# Patient Record
Sex: Female | Born: 1995 | Race: White | Hispanic: No | Marital: Single | Smoking: Never smoker
Health system: Southern US, Community
[De-identification: ages and names within clinical notes are randomized; demographics above are authoritative.]

## PROBLEM LIST (undated history)

## (undated) DIAGNOSIS — F32A Depression, unspecified: Secondary | ICD-10-CM

## (undated) DIAGNOSIS — F419 Anxiety disorder, unspecified: Secondary | ICD-10-CM

## (undated) DIAGNOSIS — F329 Major depressive disorder, single episode, unspecified: Secondary | ICD-10-CM

## (undated) DIAGNOSIS — F410 Panic disorder [episodic paroxysmal anxiety] without agoraphobia: Secondary | ICD-10-CM

## (undated) DIAGNOSIS — G43909 Migraine, unspecified, not intractable, without status migrainosus: Secondary | ICD-10-CM

## (undated) HISTORY — PX: WISDOM TOOTH EXTRACTION: SHX21

## (undated) HISTORY — DX: Depression, unspecified: F32.A

## (undated) HISTORY — DX: Anxiety disorder, unspecified: F41.9

## (undated) NOTE — Progress Notes (Signed)
Formatting of this note is different from the original.  [facility] Psychiatry - Discharge Summary    Comprehensive Psychiatric Consult Date:  08/14/20  Date of Admission: 08/14/20  Date of Discharge: 09/27/2020  Attending Provider: Patricia Nettle, MD    Discharge Diagnoses     1. MDD (major depressive disorder), recurrent, in partial remission (*)     2. GAD (generalized anxiety disorder)     3. Eating disorder, unspecified type     4. Other specific personality disorders (*)       Psychiatric Treatment Course   Becky Bryan is a 48 y.o. adult with a history significant for depression, anxietyand medical history of knee pain who presented to [facility] secondary to depressive sx and SI. Presents today for scheduled discharge visit.    On admission per initial note: In 2/22 pt reports having suicidal thoughts with plan to "slit my wrists in the bathtub". They were evaluated in a psych ED and kept over night, then started a PHP then IOP, which they finished last week. Pt feels this was "not enough" and has continued to struggle with depressive sx. Reports falling back into "old habits" of poor self-care, citing a tendency to push themselves and pt other peoples needs first. Refer to biopsychosocial for further history details.    During the course of RTC client participated appropriately and there was improvement in mood, anxiety, sleep. Client received multidisciplinary therapy including CBT, DBT, processing groups, horticulture therapy, art therapy, music therapy, nutrition therapy, and meditation. Client also had weekly visits with psychiatrist and therapist, and discharge coordination. The client was offered and completed a family meeting with their partner.     On discharge visit, pt endorses some anxiety, but overall feels excited about discharge. Has a job interview coming up. Feels hopeful, motivated. Denies safety concerns. Reviewed discharge plan including DBT.    Diagnostic clarification obtained through  serial mental status examinations, direct client interview, behavioral observations, record review, and collateral information. Discharge diagnoses include MDD, as evidenced by poor self-care, decreased appetite, anhedonia, apathy, SI; GAD as evidenced by constant fears of not being liked, regular panic attacks. 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     There was a discussion of side effects, risks, benefits, alternatives, and indications for treatment with lexapro, gabapentin, propranolol, klonopin, including but not limited to HA, GI distress, dizziness, sedation, mania, SI, addiction. The client asked appropriate questions, acknowledged understanding of answers, and provided informed consent to treatment. This medication was chosen because of prior med trials and as indicated below.     Medication changes:  - started Gabapentin 100 mg tid prn anxiety  - continued home klonopin 0.25 mg bid prn severe anxiety - would recommend tapering as appropriate to limit prolonged exposure   - continued melatonin 3 mg qhs  - started Lexapro 20 mg daily for mood (last inc 4/26)   - started propranolol 20 mg bid anxiety (last inc 5/3)    Continuity of Care: Client does not currently have an outpatient psychiatry provider    Discharge Medications:     Current Outpatient Medications:   ?  acetaminophen (TYLENOL) 500 mg tablet, Take two tablets (1,000 mg dose) by mouth every 6 (six) hours as needed for up to 10 days., Disp: 30 tablet, Rfl: 0  ?  clonazePAM (  KLONOPIN) 0.5 mg tablet, Take one half tablet (0.25 mg dose) by mouth 2 (two) times a day as needed for Anxiety (severe)., Disp: 5 tablet, Rfl: 0  ?  [START ON 09/28/2020] escitalopram oxalate (LEXAPRO) 20 mg tablet,  Take one tablet (20 mg dose) by mouth daily., Disp: 30 tablet, Rfl: 0  ?  gabapentin (NEURONTIN) 100 mg capsule, Take one capsule (100 mg dose) by mouth 3 (three) times a day as needed (anxiety)., Disp: 90 capsule, Rfl: 0  ?  Melatonin 3 MG CAPS, Take one capsule (3 mg dose) by mouth at bedtime., Disp: , Rfl: 0  ?  propranolol HCl (INDERAL) 20 mg tablet, Take one tablet (20 mg dose) by mouth 2 (two) times daily., Disp: 60 tablet, Rfl: 0  No current facility-administered medications for this visit.    Facility-Administered Medications Ordered in Other Visits:   ?  acetaminophen (TYLENOL) tablet 1,000 mg, 1,000 mg, Oral, Q6H PRN, Patricia Nettle, MD, 1,000 mg at 09/23/20 0912  ?  clonazePAM (KLONOPIN) tablet 0.25 mg, 0.25 mg, Oral, BID PRN, Patricia Nettle, MD, 0.25 mg at 09/24/20 2133  ?  ENSURE 1 box, 1 box, Oral, TID PRN, Patricia Nettle, MD  ?  escitalopram oxalate (LEXAPRO) tablet TABS 20 mg, 20 mg, Oral, Daily, Patricia Nettle, MD, 20 mg at 09/27/20 1610  ?  gabapentin (NEURONTIN) capsule 100 mg, 100 mg, Oral, TID PRN, Patricia Nettle, MD, 100 mg at 09/27/20 9604  ?  Melatonin CAPS 3 mg, 3 mg, Oral, HS, Patricia Nettle, MD, 3 mg at 09/26/20 2049  ?  propranolol HCl (INDERAL) tablet 20 mg, 20 mg, Oral, BID, Patricia Nettle, MD, 20 mg at 09/27/20 5409     Adverse Effects: denies  Compliance: good  Efficacy: good  Medication refills were: Provided  Risks, benefits, and side effects were discussed in detail prior to discharge.    -Review of PMP Aware/[place] Controlled Substance Database (on first visit, prior to sending refills, and/or with controlled substance dose changes):   -Reviewed PMP Aware and is appropriate        Safety Assessment    Suicide and Violence Risk Assessment  Inclusive of [place]-Suicide Severity Rating Scale (Screening Version - since last visit)    1.) Have you wished you were dead or wished you could go to sleep and not wake up? no  2.) Have you actually had any thoughts of killing yourself? no  3.) Have you been thinking  about how you might kill yourself? no  4.) Have you had these thoughts and had some intention of acting on them? no  5.) Have you started to work out or worked out the details of how to kill yourself and do you intend to carry out this plan? no  6.) Have you done anything, started to do anything, or prepared to do anything to end your life? no    Suicide Risk  A risk assessment was performed and client is felt to be at alowacute risk for self harm. The client remains at a moderatechronically increased future suicide risk when compared to the general population.     Risk factors include:recent SI with plan, recent substance abuse, ages 18-35/65+, chronic mental illness and history of impulsivity    These risk factors are mitigated by: a marriage/significant relationship, available support system and sense of responsibility to them, purpose for living, hope for the future, current treatment compliance, effective problem solving skills, access to mental health services and beliefs about utilization, lack of access  to weapons, motivation for treatment, improving insight and judgement, safe housing, collateral contact supporting the decision to discharge safely and safety plan with follow-up care    Violence Risk  A risk assessment was performed and client is felt to be at a low acute risk for harm to others. The client remains at a low chronically increased future danger to others when compared to the general population.     Risk factors include:young age, unemployment and recent substance abuse    These risk factors are mitigated by:no known significant past HI/aggression, no significant HI/aggression during this treatment course, lack of access to weapons, treatment compliance currently, patient perceives that psychiatric treatment is needed and effective and patient has a support system that has a good understanding of mental illness    Safety Upon Discharge  On 09/27/2020, following sustained improvement in  the affect of this client, continued report of euthymic mood, repeated denial of suicidal, homicidal, and other violent ideation, adequate interaction with peers, active participation in groups, and denial of adverse reactions from medications, the treatment team decided Becky Bryan was stable for discharge to home with scheduled mental health treatment as noted below. The client's overall risk has been mitigated by acute psychiatric stabilization, optimization of psychotropic medications, and exposure to psychosocial rehabilitative groups. The client was educated about relevant modifiable risks factors including following recommendations for treatment of psychiatric illness and abstaining from substance use. While future psychiatric events cannot be accurately predicted, the client does not currently necessitate further inpatient psychiatric care and does not currently meet [facility] involuntary commitment criteria. It is recommended that the client continue in outpatient psychiatric care. A follow up plan is in place, has been discussed with the client, and the client is in agreement with the plan at this time.     Medical Treatment Course   Admission labs were reviewed and found to be  Admission on 08/14/2020, Discharged on 09/27/2020   Component Date Value Ref Range Status   ? TB Skin Test 08/16/2020 Negative  Negative, Preliminary Positive, Presumptive Positive, Invalid, A Positive, B Positive, A & B Positive Final   ? Induration 08/16/2020 0  mm Final     An EKG was performed and was NSR, QTc 446. The patient was monitored for physical complaints, including potential medication side effects. Client was offered a History and Physical. The following medical problems were addressed during this treatment: n/a    Other pertinent labs/imaging/testing results: n/a    Condition Upon Discharge   Vitals:   Vitals:    09/27/20 0943   BP: 99/70   Pulse: 76   Resp: 18   Temp: 98 F (36.7 C)        Allergies:   Allergies   Allergen Reactions   ? Banana Itching and Swelling     Review Of Systems: Review of Systems   Constitutional: Negative.    HENT: Negative.    Respiratory: Negative.    Cardiovascular: Negative.    Gastrointestinal: Negative.    Endocrine: Negative.    Genitourinary: Negative.    Musculoskeletal: Negative.    Allergic/Immunologic: Negative.    Neurological: Negative.    Hematological: Negative.    Psychiatric/Behavioral: The patient is nervous/anxious.        Physical Exam: Physical Exam  Vitals reviewed.   Constitutional:       Appearance: Normal appearance.   HENT:      Head: Atraumatic.   Eyes:      Extraocular Movements:  Extraocular movements intact.   Musculoskeletal:         General: Normal range of motion.   Pulmonary:      Effort: Pulmonary effort is normal.   Neurological:      General: No focal deficit present.      Mental Status: Atilano Ina "Sallyanne Havers" is alert.         Mental Status Exam  Constitutional:    General Appearance Well dressed, well-groomed, normal appearance.   General Behavior Pleasant and cooperative   Musculoskeletal:    Gait and Station No gait abnormalities    Strength and tone Normal   Psychiatric:    Psychomotor Activity No motor abnormalities   Speech  Normal in rate/volume/tone   Mood  "good"   Affect  Full range/appropriate and reactive; Euthymic   Thought Process Linear, logical, and goal directed   Associations Intact association   Thought Content/Perceptual Disturbances Patient denies suicidal/homicidal ideation; No evidence of auditory/visual hallucinations or delusions;   Cognition/Sensorium  AAOx4; Memory, attention, language, and fund of knowledge intact    Insight  Good   Judgment Good     PHQ9     Depression Screen 09/27/2020 09/18/2020 09/11/2020 09/04/2020 08/28/2020    Please choose the category that best describes the patient's current state 2 2 2 2 2     Not eligible on the basis of Not applicable Not applicable Not applicable Not  applicable Not applicable    1. Little interest or pleasure in doing things 1 1 3 2 2     2. Feeling down, depressed, or hopeless 0 1 2 2 1     PHQ-2 Total Score 1 2 5 4 3     PHQ-2 Interpretation of Score for Depression (Payor) Negative Negative - - -    3. Trouble falling or staying asleep 2 2 3 2 1     4. Feeling tired or having little energy 1 2 3 3 3     5. Poor appetite or overeating 2 2 2 2 3     6. Feeling bad about yourself - or that you are a failure or have let yourself or your family down 0 1 1 1 1     7. Trouble concentrating on things, such as reading the newspaper or watching television 1 2 2 3 3     8. Moving or speaking so slowly that other people could have noticed.  Or the opposite - being so fidgety or restless that you have been moving around a lot more than usual. 0 0 0 1 0    9. Thoughts that you would be better off dead, or of hurting yourself in some way. 0 1 0 1 1    10. How difficult have these problems made it for you to do your work, take care of things at home or get along with other people? Not difficult at all Somewhat difficult Somewhat difficult Somewhat difficult Somewhat difficult    PHQ Total Score 7 12 16 17 15     Interpretation: Mild Depression, repeat at follow-up Moderate Depression, recommend consideration of treatment plan, pharmacotherapy, and/or counseling Moderately Severe Depression, immediate pharmacotherapy and/or counseling recommended Moderately Severe Depression, immediate pharmacotherapy and/or counseling recommended Moderately Severe Depression, immediate pharmacotherapy and/or counseling recommended    PHQ-9 Interpretation of Score for Depression (Payor) Negative Positive Positive Positive Positive       GAD7 Review     Generalized Anxiety Disorder 7 item (GAD-7) 09/27/2020 09/18/2020 09/11/2020 09/04/2020 08/28/2020    1. Feeling nervous, anxious, or  on the edge 2 2 3 3 3     2. Not being able to stop or control worrying 2 2 3 3 3     3. Worrying too much about different things  2 3 3 3 3     4. Trouble relaxing 1 3 3 3 3     5. Being so restless that it's hard to sit still 0 1 2 2 3     6. Being easily annoyed or irritable 0 0 2 2 2     7. Feeling afraid as if something awful might happen 2 2 3 3 3     Total Score 9 13 19 19 20     Interpretation:  Mild, consider retesting at follow up appointment   Moderate, likely to have an anxiety disorder   Severe, very likely to have an anxiety disorder   Severe, very likely to have an anxiety disorder   Severe, very likely to have an anxiety disorder                CGI Score: Global Improvement: Rate total improvement, compared to condition upon admission how much has client's condition changed? 1 = Very much improved  MADRS Score: 6   HAM-A Score: 4    Discharge Appointments and Disposition    1.) Discharged to home   2.) Follow-up therapy and psychiatry community appointments scheduled by [facility] discharge coordinator, recommend DBT    Time spent performing discharge services:  - Total time spent with client, with collateral/chart review, and in documentation: 30 minutes with >50% spent counseling/coordinating care. See psychiatric treatment course for details of counseling/care coordination.     Electronically signed by:  Patricia Nettle, MD  09/27/2020 / 8:57 PM    Dragon dictation system has been used to produce this document, this system is prone to dictation errors including grammatical and punctuation errors that may have been overlooked by me. I may be contacted for clarification if necessary.     Electronically signed by Patricia Nettle, MD at 09/27/2020  9:16 PM EDT

---

## 2018-01-10 ENCOUNTER — Emergency Department (HOSPITAL_COMMUNITY)
Admission: EM | Admit: 2018-01-10 | Discharge: 2018-01-10 | Disposition: A | Discharge status: Home / Self Care | Payer: 59 | Attending: Emergency Medicine | Admitting: Emergency Medicine

## 2018-01-10 ENCOUNTER — Encounter (HOSPITAL_COMMUNITY): Payer: Self-pay | Admitting: Emergency Medicine

## 2018-01-10 ENCOUNTER — Emergency Department (HOSPITAL_COMMUNITY): Payer: 59

## 2018-01-10 DIAGNOSIS — Z79899 Other long term (current) drug therapy: Secondary | ICD-10-CM | POA: Insufficient documentation

## 2018-01-10 DIAGNOSIS — K219 Gastro-esophageal reflux disease without esophagitis: Secondary | ICD-10-CM | POA: Insufficient documentation

## 2018-01-10 DIAGNOSIS — R079 Chest pain, unspecified: Secondary | ICD-10-CM | POA: Diagnosis present

## 2018-01-10 HISTORY — DX: Major depressive disorder, single episode, unspecified: F32.9

## 2018-01-10 HISTORY — DX: Anxiety disorder, unspecified: F41.9

## 2018-01-10 HISTORY — DX: Panic disorder (episodic paroxysmal anxiety): F41.0

## 2018-01-10 HISTORY — DX: Depression, unspecified: F32.A

## 2018-01-10 HISTORY — DX: Migraine, unspecified, not intractable, without status migrainosus: G43.909

## 2018-01-10 LAB — BASIC METABOLIC PANEL
Anion gap: 11 (ref 5–15)
BUN: 10 mg/dL (ref 6–20)
CALCIUM: 9.2 mg/dL (ref 8.9–10.3)
CO2: 24 mmol/L (ref 22–32)
CREATININE: 0.84 mg/dL (ref 0.44–1.00)
Chloride: 101 mmol/L (ref 98–111)
GFR calc Af Amer: >60 mL/min (ref >60)
Glucose, Bld: 93 mg/dL (ref 70–99)
Potassium: 3.8 mmol/L (ref 3.5–5.1)
SODIUM: 136 mmol/L (ref 135–145)

## 2018-01-10 LAB — HEPATIC FUNCTION PANEL
ALBUMIN: 4.1 g/dL (ref 3.5–5.0)
ALK PHOS: 45 U/L (ref 38–126)
ALT: 13 U/L (ref 0–44)
AST: 18 U/L (ref 15–41)
BILIRUBIN TOTAL: 0.7 mg/dL (ref 0.3–1.2)
Bilirubin, Direct: 0.1 mg/dL (ref 0.0–0.2)
Indirect Bilirubin: 0.6 mg/dL (ref 0.3–0.9)
Total Protein: 7.5 g/dL (ref 6.5–8.1)

## 2018-01-10 LAB — CBC
HCT: 43.9 % (ref 36.0–46.0)
Hemoglobin: 14.6 g/dL (ref 12.0–15.0)
MCH: 29.9 pg (ref 26.0–34.0)
MCHC: 33.3 g/dL (ref 30.0–36.0)
MCV: 89.8 fL (ref 78.0–100.0)
PLATELETS: 334 10*3/uL (ref 150–400)
RBC: 4.89 MIL/uL (ref 3.87–5.11)
RDW: 12.3 % (ref 11.5–15.5)
WBC: 8.1 10*3/uL (ref 4.0–10.5)

## 2018-01-10 LAB — I-STAT BETA HCG BLOOD, ED (MC, WL, AP ONLY)

## 2018-01-10 LAB — I-STAT TROPONIN, ED: Troponin i, poc: 0.02 ng/mL (ref 0.00–0.08)

## 2018-01-10 LAB — LIPASE, BLOOD: Lipase: 32 U/L (ref 11–51)

## 2018-01-10 MED ORDER — OMEPRAZOLE 20 MG PO CPDR: 20.0000 mg | DELAYED_RELEASE_CAPSULE | Freq: Every day | ORAL | 1 refills | Status: DC

## 2018-01-10 MED ORDER — GI COCKTAIL ~~LOC~~
30.0000 mL | Freq: Once | ORAL | Status: AC
  Administered 2018-01-10: 30 mL via ORAL
  Filled 2018-01-10: qty 30

## 2018-01-10 NOTE — ED Provider Notes (Signed)
[facility] EMERGENCY DEPARTMENT Provider Note   CSN: [id number] Arrival date & time: 01/10/18  1828     History   Chief Complaint Chief Complaint  Patient presents with  . Chest Pain    HPI Erika Durham is a 22 y.o. female.  HPI Pt started having sharp pain on the left side of her chest.  She also has noticed numbness and tingling in her left arm.   The symptoms started around 4pm.  SHe thought it could be anxiety so she took her klonopin but her chest pain got worse.  NO history of heart disease or DVT.  NO history of heart or lung disease in the immediate family.      Past Medical History:  Diagnosis Date  . Anxiety   . Depression   . Migraine   . Panic attack     There are no active problems to display for this patient.   History reviewed. No pertinent surgical history.   OB History   None      Home Medications    Prior to Admission medications   Medication Sig Start Date End Date Taking? Authorizing Provider  buPROPion (WELLBUTRIN XL) 300 MG 24 hr tablet Take 300 mg by mouth daily. 01/02/18  Yes [provider]  clonazePAM (KLONOPIN) 1 MG disintegrating tablet Take 1 mg by mouth daily as needed for anxiety.   Yes [provider]  omeprazole (PRILOSEC) 20 MG capsule Take 1 capsule (20 mg total) by mouth daily. 01/10/18   Linwood Dibbles, MD    Family History No family history on file.  Social History Social History   Tobacco Use  . Smoking status: Not on file  Substance Use Topics  . Alcohol use: Not on file  . Drug use: Not on file     Allergies   Rizatriptan   Review of Systems Review of Systems  All other systems reviewed and are negative.    Physical Exam Updated Vital Signs BP 129/90 (BP Location: Right Arm)   Pulse 97   Temp 98.5 F (36.9 C) (Oral)   Resp 17   LMP 01/03/2018   SpO2 98%   Physical Exam  Constitutional: She appears well-developed and well-nourished. No distress.  HENT:  Head:  Normocephalic and atraumatic.  Right Ear: External ear normal.  Left Ear: External ear normal.  Eyes: Conjunctivae are normal. Right eye exhibits no discharge. Left eye exhibits no discharge. No scleral icterus.  Neck: Neck supple. No tracheal deviation present.  Cardiovascular: Normal rate, regular rhythm and intact distal pulses.  Pulmonary/Chest: Effort normal and breath sounds normal. No stridor. No respiratory distress. She has no wheezes. She has no rales.  Abdominal: Soft. Bowel sounds are normal. She exhibits no distension. There is tenderness. There is no rebound and no guarding.  ttp epigastric region  Musculoskeletal: She exhibits no edema or tenderness.  Neurological: She is alert. She has normal strength. No cranial nerve deficit (no facial droop, extraocular movements intact, no slurred speech) or sensory deficit. She exhibits normal muscle tone. She displays no seizure activity. Coordination normal.  Skin: Skin is warm and dry. No rash noted.  Psychiatric: She has a normal mood and affect.  Nursing note and vitals reviewed.    ED Treatments / Results  Labs (all labs ordered are listed, but only abnormal results are displayed) Labs Reviewed  BASIC METABOLIC PANEL  CBC  LIPASE, BLOOD  HEPATIC FUNCTION PANEL  I-STAT TROPONIN, ED  I-STAT BETA HCG  BLOOD, ED ([facility], [facility], [facility] ONLY)    EKG EKG Interpretation  Date/Time:  Saturday January 10 2018 18:43:32 EDT Ventricular Rate:  94 PR Interval:  146 QRS Duration: 92 QT Interval:  364 QTC Calculation: 455 R Axis:   82 Text Interpretation:  Normal sinus rhythm Normal ECG No old tracing to compare Confirmed by Linwood Dibbles [id number]) on 01/10/2018 9:00:07 PM   Radiology Dg Chest 2 View  Result Date: 01/10/2018 CLINICAL DATA:  Chest pain, shortness of breath, and dizziness beginning this morning. EXAM: CHEST - 2 VIEW COMPARISON:  None. FINDINGS: The heart size and mediastinal contours are within normal limits. Both lungs are clear.  The visualized skeletal structures are unremarkable. IMPRESSION: Negative.  No active cardiopulmonary disease. Electronically Signed   By: Myles Rosenthal M.D.   On: 01/10/2018 19:37    Procedures Procedures (including critical care time)  Medications Ordered in ED Medications  gi cocktail (Maalox,Lidocaine,Donnatal) (30 mLs Oral Given 01/10/18 2123)     Initial Impression / Assessment and Plan / ED Course  I have reviewed the triage vital signs and the nursing notes.  Pertinent labs & imaging results that were available during my care of the patient were reviewed by me and considered in my medical decision making (see chart for details).   Patient presented to the emergency room for evaluation of chest pain.  On exam the patient had some mild epigastric tenderness.  Laboratory tests are reassuring.  She is very low risk for acute coronary syndromes.  EKG and cardiac enzymes are normal.  Patient also is risk for PE and PERC negative.  Patient may be having some esophageal spasm associated with gastroesophageal reflux.  Plan on discharge home with antacids.  Follow-up with primary care doctor.  Final Clinical Impressions(s) / ED Diagnoses   Final diagnoses:  Gastroesophageal reflux disease, esophagitis presence not specified    ED Discharge Orders         Ordered    omeprazole (PRILOSEC) 20 MG capsule  Daily     01/10/18 2206           Linwood Dibbles, MD 01/10/18 2209

## 2018-01-10 NOTE — ED Triage Notes (Signed)
Pt states 2 hours of chest pain to left breast, left arm, pt also feels dizzy with shortness of breath. States she has hx of anxiety and panic attacks but states this does not feel like a panic attack and her anxiety medication did not help. Pain currently 4/10.

## 2018-01-10 NOTE — Discharge Instructions (Addendum)
Take the medications as prescribed, follow-up with a primary care doctor °

## 2018-01-10 NOTE — ED Notes (Signed)
Main lab to add on HFP, lipase

## 2018-01-10 NOTE — ED Notes (Signed)
ED Provider at bedside. 

## 2018-11-07 ENCOUNTER — Other Ambulatory Visit: Payer: Self-pay

## 2018-11-07 ENCOUNTER — Ambulatory Visit (HOSPITAL_COMMUNITY)
Admission: EM | Admit: 2018-11-07 | Discharge: 2018-11-07 | Disposition: A | Discharge status: Home / Self Care | Payer: 59 | Attending: Family Medicine | Admitting: Family Medicine

## 2018-11-07 ENCOUNTER — Ambulatory Visit (INDEPENDENT_AMBULATORY_CARE_PROVIDER_SITE_OTHER): Payer: 59

## 2018-11-07 ENCOUNTER — Encounter (HOSPITAL_COMMUNITY): Payer: Self-pay

## 2018-11-07 DIAGNOSIS — S92425A Nondisplaced fracture of distal phalanx of left great toe, initial encounter for closed fracture: Secondary | ICD-10-CM

## 2018-11-07 MED ORDER — IBUPROFEN 800 MG PO TABS
ORAL_TABLET | ORAL | Status: AC
  Filled 2018-11-07: qty 1

## 2018-11-07 MED ORDER — IBUPROFEN 800 MG PO TABS
800.0000 mg | ORAL_TABLET | Freq: Once | ORAL | Status: AC
  Administered 2018-11-07: 14:00:00 800 mg via ORAL

## 2018-11-07 NOTE — ED Triage Notes (Signed)
Patient presents to Urgent Care with complaints of left big toe pain since a bed frame fell on her foot last night. Patient reports she has sharp pain when trying to bend her toes, pt needs work note to be out of work today.

## 2018-11-07 NOTE — ED Provider Notes (Signed)
[facility]   [id number] 11/07/18 Arrival Time: 1153  ASSESSMENT & PLAN:  1. Nondisplaced fracture of distal phalanx of left great toe, initial encounter for closed fracture     I have personally viewed the imaging studies ordered this visit. L great toe fracture.  Meds ordered this encounter  Medications  . ibuprofen (ADVIL) tablet 800 mg    Orders Placed This Encounter  Procedures  . DG Foot Complete Left  . Apply cam walker    Follow-up Information    [facility], [facility].   Specialty: Specialist Contact information: [address] [place] [id number] [phone]          OTC ibuprofen/Tylenol as needed. Work note provided.  Reviewed expectations re: course of current medical issues. Questions answered. Outlined signs and symptoms indicating need for more acute intervention. Patient verbalized understanding. After Visit Summary given.  SUBJECTIVE: History from: patient. Erika Durham is a 23 y.o. female who reports fairly persistent moderate pain of her left great toe; described as aching without radiation. Onset: abrupt, yest evening. Injury/trama: reports bed frame fell onto foot/toe. Symptoms have progressed to a point and plateaued since beginning. Aggravating factors: movement/weight bearing. Alleviating factors: rest. Associated symptoms: none reported. Extremity sensation changes or weakness: none. Self treatment: ibuprofen; helps with pain. History of similar: no.  History reviewed. No pertinent surgical history.   ROS: As per HPI. All other systems negative.   OBJECTIVE:  Vitals:   11/07/18 1230  BP: 116/74  Pulse: 96  Resp: 16  Temp: 98.5 F (36.9 C)  TempSrc: Oral  SpO2: 100%    General appearance: alert; no distress HEENT: [place]; AT Neck: supple with FROM Resp: unlabored respirations Extremities: . LLE: warm and well perfused; well localized moderate tenderness over left mid to distal great toe; without  gross deformities; with mild swelling; with mild bruising; ROM: limited by pain CV: brisk extremity capillary refill of LLE; 2+ DP/PT pulse of LLE. Skin: warm and dry; no visible rashes Neurologic: gait normal but favors LLE; normal reflexes of LLE; normal sensation of LLE; normal strength of LLE Psychological: alert and cooperative; normal mood and affect  Imaging: Dg Foot Complete Left  Result Date: 11/07/2018 CLINICAL DATA:  Injury, pain, trauma EXAM: LEFT FOOT - COMPLETE 3+ VIEW COMPARISON:  None. FINDINGS: There is a subtle linear fracture without displacement of the left great toe distal phalanx laterally. Mild diffuse soft tissue swelling. No associated subluxation or dislocation. No other joint or osseous abnormality. IMPRESSION: Acute nondisplaced fracture left great toe distal phalanx. Electronically Signed   By: Judie PetitM.  Shick M.D.   On: 11/07/2018 13:33      Allergies  Allergen Reactions  . Rizatriptan Anxiety    Other reaction(s): Other (See Comments) Body wide migraine.  Right side body pain.     Past Medical History:  Diagnosis Date  . Anxiety   . Depression   . Migraine   . Panic attack    Social History   Socioeconomic History  . Marital status: Single    Spouse name: Not on file  . Number of children: Not on file  . Years of education: Not on file  . Highest education level: Not on file  Occupational History  . Not on file  Social Needs  . Financial resource strain: Not on file  . Food insecurity    Worry: Not on file    Inability: Not on file  . Transportation needs    Medical: Not on file  Non-medical: Not on file  Tobacco Use  . Smoking status: Never Smoker  . Smokeless tobacco: Never Used  Substance and Sexual Activity  . Alcohol use: Yes    Comment: socially  . Drug use: Yes    Types: Marijuana    Comment: socially  . Sexual activity: Not on file  Lifestyle  . Physical activity    Days per week: Not on file    Minutes per session: Not on  file  . Stress: Not on file  Relationships  . Social Herbalist on phone: Not on file    Gets together: Not on file    Attends religious service: Not on file    Active member of club or organization: Not on file    Attends meetings of clubs or organizations: Not on file    Relationship status: Not on file  Other Topics Concern  . Not on file  Social History Narrative  . Not on file   Family History  Problem Relation Age of Onset  . Healthy Mother   . Healthy Father    History reviewed. No pertinent surgical history.    Vanessa Kick, MD 11/07/18 1406

## 2019-08-12 ENCOUNTER — Other Ambulatory Visit: Payer: Self-pay

## 2019-08-12 ENCOUNTER — Encounter (HOSPITAL_COMMUNITY): Payer: Self-pay | Admitting: Emergency Medicine

## 2019-08-12 ENCOUNTER — Emergency Department (HOSPITAL_COMMUNITY)
Admission: EM | Admit: 2019-08-12 | Discharge: 2019-08-12 | Disposition: A | Discharge status: Home / Self Care | Payer: 59 | Attending: Emergency Medicine | Admitting: Emergency Medicine

## 2019-08-12 ENCOUNTER — Emergency Department (HOSPITAL_COMMUNITY): Payer: 59

## 2019-08-12 DIAGNOSIS — Y929 Unspecified place or not applicable: Secondary | ICD-10-CM | POA: Insufficient documentation

## 2019-08-12 DIAGNOSIS — Y9301 Activity, walking, marching and hiking: Secondary | ICD-10-CM | POA: Diagnosis not present

## 2019-08-12 DIAGNOSIS — Y999 Unspecified external cause status: Secondary | ICD-10-CM | POA: Insufficient documentation

## 2019-08-12 DIAGNOSIS — S8992XA Unspecified injury of left lower leg, initial encounter: Secondary | ICD-10-CM | POA: Diagnosis present

## 2019-08-12 DIAGNOSIS — W0110XA Fall on same level from slipping, tripping and stumbling with subsequent striking against unspecified object, initial encounter: Secondary | ICD-10-CM | POA: Insufficient documentation

## 2019-08-12 MED ORDER — OXYCODONE-ACETAMINOPHEN 5-325 MG PO TABS: 1.0000 | ORAL_TABLET | Freq: Four times a day (QID) | ORAL | 0 refills | Status: DC | PRN

## 2019-08-12 MED ORDER — HYDROMORPHONE HCL 1 MG/ML IJ SOLN
0.5000 mg | Freq: Once | INTRAMUSCULAR | Status: AC
  Administered 2019-08-12: 0.5 mg via INTRAMUSCULAR
  Filled 2019-08-12: qty 1

## 2019-08-12 NOTE — ED Provider Notes (Signed)
[facility]-EMERGENCY DEPT Provider Note   CSN: [id number] Arrival date & time: 08/12/19  0033     History Chief Complaint  Patient presents with  . Knee Pain    Erika Durham is a 24 y.o. female with a history of anxiety and depression who presents to the emergency department with a chief complaint of left knee injury.  The patient reports that she was walking earlier tonight when she tripped and "dislocated her knee" reports that when she looked down at the knee that her kneecap appeared off to the side.  She reports sudden onset, severe pain.  She was unable to stand or put weight on the leg.  She reports that while she was laying on the ground that she was able to straighten her leg and felt the knee pop back into place.  She reports there was some improvement in pain, but she still is unable to bear weight on the leg.  Reports that she had a similar episode when she was 11.  She has no numbness or weakness.  No ankle or hip pain.  No other treatment prior to arrival.  She is not established with an orthopedist.  The history is provided by the patient. No language interpreter was used.       Past Medical History:  Diagnosis Date  . Anxiety   . Depression   . Migraine   . Panic attack     There are no problems to display for this patient.   History reviewed. No pertinent surgical history.   OB History   No obstetric history on file.     Family History  Problem Relation Age of Onset  . Healthy Mother   . Healthy Father     Social History   Tobacco Use  . Smoking status: Never Smoker  . Smokeless tobacco: Never Used  Substance Use Topics  . Alcohol use: Yes    Comment: socially  . Drug use: Yes    Types: Marijuana    Comment: socially    Home Medications Prior to Admission medications   Medication Sig Start Date End Date Taking? Authorizing Provider  buPROPion (WELLBUTRIN XL) 300 MG 24 hr tablet Take 300 mg by mouth daily. 01/02/18    [provider]  clonazePAM (KLONOPIN) 1 MG disintegrating tablet Take 1 mg by mouth daily as needed for anxiety.    [provider]  omeprazole (PRILOSEC) 20 MG capsule Take 1 capsule (20 mg total) by mouth daily. 01/10/18   Linwood Dibbles, MD  oxyCODONE-acetaminophen (PERCOCET/ROXICET) 5-325 MG tablet Take 1 tablet by mouth every 6 (six) hours as needed for severe pain. 08/12/19   Trellis Guirguis A, PA-C    Allergies    Rizatriptan  Review of Systems   Review of Systems  Constitutional: Negative for activity change.  Respiratory: Negative for shortness of breath.   Cardiovascular: Negative for chest pain.  Gastrointestinal: Negative for abdominal pain, diarrhea, nausea and vomiting.  Genitourinary: Negative for dysuria.  Musculoskeletal: Positive for arthralgias and myalgias. Negative for back pain, joint swelling, neck pain and neck stiffness.  Skin: Negative for rash.  Allergic/Immunologic: Negative for immunocompromised state.  Neurological: Negative for dizziness, seizures, syncope, weakness, numbness and headaches.  Psychiatric/Behavioral: Negative for confusion.    Physical Exam Updated Vital Signs BP 120/89 (BP Location: Right Arm)   Pulse 80   Temp 98 F (36.7 C) (Oral)   Resp 16   Ht 5\' 1"  (1.549 m)   Wt 79.4  kg   LMP 08/01/2019   SpO2 100%   BMI 33.07 kg/m   Physical Exam Vitals and nursing note reviewed.  Constitutional:      General: She is not in acute distress. HENT:     Head: Normocephalic.  Eyes:     Conjunctiva/sclera: Conjunctivae normal.  Cardiovascular:     Rate and Rhythm: Normal rate and regular rhythm.     Heart sounds: No murmur. No friction rub. No gallop.   Pulmonary:     Effort: Pulmonary effort is normal. No respiratory distress.  Abdominal:     General: There is no distension.     Palpations: Abdomen is soft.  Musculoskeletal:     Cervical back: Neck supple.     Right lower leg: No edema.     Left lower leg: No edema.      Comments: Diffusely tender to palpation to the left knee.  No obvious deformities.  Normal exam of the left ankle.  Range of motion of the left hip deferred.  Patient has considerable pain with range of motion of the left knee.  She is neurovascularly intact to the left lower extremity.   Skin:    General: Skin is warm.     Findings: No rash.  Neurological:     Mental Status: She is alert.  Psychiatric:        Behavior: Behavior normal.     ED Results / Procedures / Treatments   Labs (all labs ordered are listed, but only abnormal results are displayed) Labs Reviewed - No data to display  EKG None  Radiology DG Knee Complete 4 Views Left  Result Date: 08/12/2019 CLINICAL DATA:  Status post trauma. EXAM: LEFT KNEE - COMPLETE 4+ VIEW COMPARISON:  None. FINDINGS: No evidence of fracture, dislocation, or joint effusion. No evidence of arthropathy or other focal bone abnormality. Soft tissues are unremarkable. IMPRESSION: Negative. Electronically Signed   By: Aram Candela M.D.   On: 08/12/2019 01:56    Procedures Procedures (including critical care time)  Medications Ordered in ED Medications  HYDROmorphone (DILAUDID) injection 0.5 mg (0.5 mg Intramuscular Given 08/12/19 0454)    ED Course  I have reviewed the triage vital signs and the nursing notes.  Pertinent labs & imaging results that were available during my care of the patient were reviewed by me and considered in my medical decision making (see chart for details).    MDM Rules/Calculators/A&P                      24 year old female with history of depression anxiety who presents to the emergency department after a left knee injury earlier tonight.  Reports that she tripped and injured her left knee.  When she looked down, she saw the kneecap off to the side of the knee.  She was able to straighten her leg out while she was on the ground and the patella moved back into place.  On exam, she is diffusely tender to  palpation to the left knee.  No obvious deformities.  She is neurovascularly intact.  Joint does not appear unstable.  X-ray of the left knee is unremarkable.  Suspect the patient had a patellar dislocation.  Doubt knee dislocation She reports of history of prior dislocation in childhood.  She is not established with an orthopedist.  We will place the patient in a knee immobilizer and give her a set of crutches.  Will provide referral to orthopedics.  Pain controlled in  the ER.  We will send her home with a short course of pain medication.  RICE therapy recommended.  She is hemodynamically stable to no acute distress.  Safe for discharge home with outpatient follow-up as indicated.  Final Clinical Impression(s) / ED Diagnoses Final diagnoses:  Injury of left knee, initial encounter    Rx / DC Orders ED Discharge Orders         Ordered    oxyCODONE-acetaminophen (PERCOCET/ROXICET) 5-325 MG tablet  Every 6 hours PRN     08/12/19 0447           Danielys Madry, Maree Erie A, PA-C 08/12/19 2330    Varney Biles, MD 08/19/19 [phone]

## 2019-08-12 NOTE — Discharge Instructions (Signed)
Thank you for allowing me to care for you today in the Emergency Department.   Wear the knee immobilizer 24 hours a day until you are seen by Dr. Ave Filter or one of his colleagues.  Dr. Ave Filter is an orthopedist.  His office information is listed above.  Use the crutches until you can put weight on the left foot without considerable pain.  Take 650 mg of Tylenol or 600 mg of ibuprofen with food every 6 hours for pain.  You can alternate between these 2 medications every 3 hours if your pain returns.  For instance, you can take Tylenol at noon, followed by a dose of ibuprofen at 3, followed by second dose of Tylenol and 6.  For severe, uncontrollable pain, you can take 1 tablet of Percocet.  Percocet is a narcotic.  It can be addicting.  Do not take this with other medications or substances, including alcohol, that may make you drowsy.  Do not work or drive while taking this medication because it causes you to be impaired.  You should only use this medication if your pain is severe and uncontrollable.  Try to elevate your left leg so that your toes are at or above the level of your nose to help with pain and swelling.  Return to the emergency department if you have any fall or injury, if you develop severe swelling in your calf or thigh, if you have new numbness or weakness, if your toes or foot turns blue, or if you develop other new, concerning symptoms.

## 2020-06-13 ENCOUNTER — Other Ambulatory Visit: Payer: Self-pay

## 2020-06-13 ENCOUNTER — Encounter (HOSPITAL_COMMUNITY): Payer: Self-pay | Admitting: Emergency Medicine

## 2020-06-13 ENCOUNTER — Ambulatory Visit (HOSPITAL_COMMUNITY)
Admission: RE | Admit: 2020-06-13 | Discharge: 2020-06-13 | Disposition: A | Discharge status: Home / Self Care | Payer: 59 | Attending: Internal Medicine | Admitting: Internal Medicine

## 2020-06-13 ENCOUNTER — Ambulatory Visit (HOSPITAL_COMMUNITY)
Admission: EM | Admit: 2020-06-13 | Discharge: 2020-06-14 | Disposition: A | Discharge status: Home / Self Care | Payer: 59 | Attending: Psychiatric/Mental Health | Admitting: Psychiatric/Mental Health

## 2020-06-13 DIAGNOSIS — Z79899 Other long term (current) drug therapy: Secondary | ICD-10-CM | POA: Insufficient documentation

## 2020-06-13 DIAGNOSIS — F332 Major depressive disorder, recurrent severe without psychotic features: Secondary | ICD-10-CM | POA: Diagnosis present

## 2020-06-13 DIAGNOSIS — Z20822 Contact with and (suspected) exposure to covid-19: Secondary | ICD-10-CM | POA: Diagnosis not present

## 2020-06-13 LAB — LIPID PANEL
Cholesterol: 159 mg/dL (ref 0–200)
HDL: 52 mg/dL (ref >40)
LDL Cholesterol: 96 mg/dL (ref 0–99)
Total CHOL/HDL Ratio: 3.1 RATIO
Triglycerides: 53 mg/dL (ref <150)
VLDL: 11 mg/dL (ref 0–40)

## 2020-06-13 LAB — POCT URINALYSIS DIP (DEVICE)
Bilirubin Urine: NEGATIVE
Glucose, UA: NEGATIVE mg/dL
Hgb urine dipstick: NEGATIVE
Ketones, ur: 15 mg/dL — AB
Leukocytes,Ua: NEGATIVE
Nitrite: NEGATIVE
Protein, ur: NEGATIVE mg/dL
Specific Gravity, Urine: >=1.03 (ref 1.005–1.030)
Urobilinogen, UA: 0.2 mg/dL (ref 0.0–1.0)
pH: 7 (ref 5.0–8.0)

## 2020-06-13 LAB — CBC WITH DIFFERENTIAL/PLATELET
Abs Immature Granulocytes: 0.02 10*3/uL (ref 0.00–0.07)
Basophils Absolute: 0 10*3/uL (ref 0.0–0.1)
Basophils Relative: 0 %
Eosinophils Absolute: 0.2 10*3/uL (ref 0.0–0.5)
Eosinophils Relative: 2 %
HCT: 44.5 % (ref 36.0–46.0)
Hemoglobin: 15.8 g/dL — ABNORMAL HIGH (ref 12.0–15.0)
Immature Granulocytes: 0 %
Lymphocytes Relative: 29 %
Lymphs Abs: 2.7 10*3/uL (ref 0.7–4.0)
MCH: 31.1 pg (ref 26.0–34.0)
MCHC: 35.5 g/dL (ref 30.0–36.0)
MCV: 87.6 fL (ref 80.0–100.0)
Monocytes Absolute: 0.5 10*3/uL (ref 0.1–1.0)
Monocytes Relative: 6 %
Neutro Abs: 5.9 10*3/uL (ref 1.7–7.7)
Neutrophils Relative %: 63 %
Platelets: 335 10*3/uL (ref 150–400)
RBC: 5.08 MIL/uL (ref 3.87–5.11)
RDW: 12.8 % (ref 11.5–15.5)
WBC: 9.3 10*3/uL (ref 4.0–10.5)
nRBC: 0 % (ref 0.0–0.2)

## 2020-06-13 LAB — POCT URINE DRUG SCREEN - MANUAL ENTRY (I-SCREEN)
POC Amphetamine UR: NOT DETECTED
POC Buprenorphine (BUP): NOT DETECTED
POC Cocaine UR: NOT DETECTED
POC Marijuana UR: NOT DETECTED
POC Methadone UR: NOT DETECTED
POC Methamphetamine UR: NOT DETECTED
POC Morphine: NOT DETECTED
POC Oxazepam (BZO): NOT DETECTED
POC Oxycodone UR: NOT DETECTED
POC Secobarbital (BAR): NOT DETECTED

## 2020-06-13 LAB — COMPREHENSIVE METABOLIC PANEL
ALT: 15 U/L (ref 0–44)
AST: 24 U/L (ref 15–41)
Albumin: 4.8 g/dL (ref 3.5–5.0)
Alkaline Phosphatase: 37 U/L — ABNORMAL LOW (ref 38–126)
Anion gap: 12 (ref 5–15)
BUN: 8 mg/dL (ref 6–20)
CO2: 23 mmol/L (ref 22–32)
Calcium: 9.6 mg/dL (ref 8.9–10.3)
Chloride: 102 mmol/L (ref 98–111)
Creatinine, Ser: 0.76 mg/dL (ref 0.44–1.00)
GFR, Estimated: >60 mL/min (ref >60)
Glucose, Bld: 68 mg/dL — ABNORMAL LOW (ref 70–99)
Potassium: 4.5 mmol/L (ref 3.5–5.1)
Sodium: 137 mmol/L (ref 135–145)
Total Bilirubin: 1.4 mg/dL — ABNORMAL HIGH (ref 0.3–1.2)
Total Protein: 7.9 g/dL (ref 6.5–8.1)

## 2020-06-13 LAB — RESP PANEL BY RT-PCR (FLU A&B, COVID) ARPGX2
Influenza A by PCR: NEGATIVE
Influenza B by PCR: NEGATIVE
SARS Coronavirus 2 by RT PCR: NEGATIVE

## 2020-06-13 LAB — TSH: TSH: 1.499 u[IU]/mL (ref 0.350–4.500)

## 2020-06-13 LAB — POCT PREGNANCY, URINE: Preg Test, Ur: NEGATIVE

## 2020-06-13 LAB — POC SARS CORONAVIRUS 2 AG -  ED: SARS Coronavirus 2 Ag: NEGATIVE

## 2020-06-13 LAB — ETHANOL: Alcohol, Ethyl (B): <10 mg/dL (ref <10)

## 2020-06-13 LAB — POC SARS CORONAVIRUS 2 AG: SARS Coronavirus 2 Ag: NEGATIVE

## 2020-06-13 MED ORDER — ACETAMINOPHEN 325 MG PO TABS: 650.0000 mg | ORAL_TABLET | Freq: Four times a day (QID) | ORAL | Status: DC | PRN

## 2020-06-13 MED ORDER — ALUM & MAG HYDROXIDE-SIMETH 200-200-20 MG/5ML PO SUSP: 30.0000 mL | ORAL | Status: DC | PRN

## 2020-06-13 MED ORDER — CLONAZEPAM 0.25 MG PO TBDP: 0.2500 mg | ORAL_TABLET | Freq: Every day | ORAL | Status: DC | PRN

## 2020-06-13 MED ORDER — TRAZODONE HCL 50 MG PO TABS
50.0000 mg | ORAL_TABLET | Freq: Every evening | ORAL | Status: DC | PRN
  Administered 2020-06-14: 50 mg via ORAL
  Filled 2020-06-13: qty 1

## 2020-06-13 MED ORDER — BUPROPION HCL ER (XL) 150 MG PO TB24
150.0000 mg | ORAL_TABLET | Freq: Every day | ORAL | Status: DC
  Administered 2020-06-14: 150 mg via ORAL
  Filled 2020-06-13: qty 1

## 2020-06-13 MED ORDER — MAGNESIUM HYDROXIDE 400 MG/5ML PO SUSP: 30.0000 mL | Freq: Every day | ORAL | Status: DC | PRN

## 2020-06-13 MED ORDER — HYDROXYZINE HCL 25 MG PO TABS
25.0000 mg | ORAL_TABLET | Freq: Three times a day (TID) | ORAL | Status: DC | PRN
  Administered 2020-06-14: 25 mg via ORAL
  Filled 2020-06-13: qty 1

## 2020-06-13 NOTE — ED Triage Notes (Signed)
Presents with depression, anxiety and suicidal thoughts increasing in severity for past week. Plan to slit wrists.  Denies HI or AVH.

## 2020-06-13 NOTE — H&P (Signed)
Behavioral Health Medical Screening Exam  Erika Durham is an 25 y.o. female with history of anxiety and depression. She is presenting for increased depression with SI to slit her wrists in the bathtub. She reports SI started 8 days ago but worsened and developed a plan today. Denies HI/AVH. She is currently seeing a counselor but denies any medications in the last year, other than PRN Klonopin 0.25 mg, which she reports taking about once per week, verified on PDMP review. She has history of suicide attempt via overdose on pills when she was 17 and was hospitalized at [facility] at that time. She reports doing IOP at [facility] one year ago. Denies HI/AVH. She reports doing well on Wellbutrin in the past.   Total Time spent with patient: 30 minutes  Psychiatric Specialty Exam: Physical Exam Vitals and nursing note reviewed.  Constitutional:      Appearance: She is well-developed and well-nourished.  Cardiovascular:     Rate and Rhythm: Normal rate.  Pulmonary:     Effort: Pulmonary effort is normal.  Neurological:     Mental Status: She is alert and oriented to person, place, and time.    Review of Systems  Constitutional: Negative.   Respiratory: Negative for cough and shortness of breath.   Psychiatric/Behavioral: Positive for dysphoric mood and suicidal ideas. Negative for agitation, behavioral problems, confusion, decreased concentration, hallucinations, self-injury and sleep disturbance. The patient is nervous/anxious. The patient is not hyperactive.    Blood pressure 124/90, pulse 95, temperature 98.6 F (37 C), temperature source Oral, resp. rate 18, SpO2 99 %.There is no height or weight on file to calculate BMI. General Appearance: Casual Eye Contact:  Fair Speech:  Normal Rate Volume:  Normal Mood:  Anxious and Depressed Affect:  Congruent Thought Process:  Coherent and Goal Directed Orientation:  Full (Time, Place, and Person) Thought Content:  Logical Suicidal Thoughts:   Yes.  with intent/plan Homicidal Thoughts:  No Memory:  Immediate;   Good Recent;   Good Remote;   Good Judgement:  Intact Insight:  Fair Psychomotor Activity:  Normal Concentration: Concentration: Fair and Attention Span: Fair Recall:  [employer] of Knowledge:Fair Language: Fair Akathisia:  No Handed:  Right AIMS (if indicated):    Assets:  Communication Skills Desire for Improvement Housing Social Support Vocational/Educational Sleep:     Musculoskeletal: Strength & Muscle Tone: within normal limits Gait & Station: normal Patient leans: N/A  Blood pressure 124/90, pulse 95, temperature 98.6 F (37 C), temperature source Oral, resp. rate 18, SpO2 99 %.  Recommendations: Based on my evaluation the patient does not appear to have an emergency medical condition.  Inpatient hospitalization. Per [facility], no available beds at [facility]. Patient to transfer to [facility]. Reports called to [facility] provider and nursing.  Aldean Baker, NP 06/13/2020, 5:54 PM
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
## 2020-06-13 NOTE — ED Notes (Signed)
Pt belongings in locker #29. 

## 2020-06-13 NOTE — ED Notes (Signed)
Pt sleeping at present, no distress noted, monitoring for safety. 

## 2020-06-13 NOTE — ED Provider Notes (Signed)
Behavioral Health Admission H&P [facility] & OBS)  Date: 06/13/20 Patient Name: Erika Durham MRN: [id number] Chief Complaint:  Chief Complaint  Patient presents with  . Psychiatric Evaluation      Diagnoses:  Final diagnoses:  MDD (major depressive disorder), recurrent severe, without psychosis (HCC)    HPI: Erika Durham is an 25 y.o. female with history of anxiety and depression. She is presenting for increased depression with SI to slit her wrists in the bathtub. She reports SI started 8 days ago but worsened and developed a plan today. Denies HI/AVH. She is currently seeing a counselor but denies any medications in the last year, other than PRN Klonopin 0.25 mg, which she reports taking about once per week, verified on PDMP review. She has history of suicide attempt via overdose on pills when she was 17 and was hospitalized at [facility] at that time. She reports doing IOP at [facility] one year ago. Denies HI/AVH. She reports doing well on Wellbutrin in the past.   PHQ 2-9:     Total Time spent with patient: 30 minutes  Musculoskeletal  Strength & Muscle Tone: within normal limits Gait & Station: normal Patient leans: N/A  Psychiatric Specialty Exam  Presentation General Appearance: Casual  Eye Contact:Fair  Speech:Normal Rate  Speech Volume:Normal  Handedness:No data recorded  Mood and Affect  Mood:Anxious; Depressed  Affect:Congruent   Thought Process  Thought Processes:Coherent  Descriptions of Associations:Intact  Orientation:Full (Time, Place and Person)  Thought Content:Logical  Hallucinations:Hallucinations: None  Ideas of Reference:None  Suicidal Thoughts:Suicidal Thoughts: Yes, Active SI Active Intent and/or Plan: With Plan; With Intent  Homicidal Thoughts:Homicidal Thoughts: No   Sensorium  Memory:Immediate Good; Recent Good; Remote Good  Judgment:Intact  Insight:Fair   Executive Functions  Concentration:Fair  Attention  Span:Fair  Recall:Fair  Fund of Knowledge:Fair  Language:Fair   Psychomotor Activity  Psychomotor Activity:Psychomotor Activity: Normal   Assets  Assets:Communication Skills; Desire for Improvement; Financial Resources/Insurance; Housing; Social Support; Vocational/Educational   Sleep  Sleep:Sleep: Fair   Physical Exam Vitals reviewed.  Constitutional:      Appearance: Normal appearance. She is normal weight.  Cardiovascular:     Rate and Rhythm: Normal rate.  Pulmonary:     Effort: Pulmonary effort is normal.  Musculoskeletal:        General: Normal range of motion.  Neurological:     Mental Status: She is alert.    Review of Systems  Constitutional: Negative.   Respiratory: Negative for cough and shortness of breath.   Cardiovascular: Negative for chest pain.  Psychiatric/Behavioral: Positive for depression and suicidal ideas. Negative for hallucinations, memory loss and substance abuse. The patient is nervous/anxious.     Blood pressure 122/87, pulse 100, temperature 98 F (36.7 C), temperature source Temporal, resp. rate 16, SpO2 99 %. There is no height or weight on file to calculate BMI.  Past Psychiatric History:    Is the patient at risk to self? Yes  Has the patient been a risk to self in the past 6 months? No .    Has the patient been a risk to self within the distant past? Yes   Is the patient a risk to others? No   Has the patient been a risk to others in the past 6 months? No   Has the patient been a risk to others within the distant past? No   Past Medical History:  Past Medical History:  Diagnosis Date  . Anxiety   . Depression   .  Migraine   . Panic attack    No past surgical history on file.  Family History:  Family History  Problem Relation Age of Onset  . Healthy Mother   . Healthy Father     Social History:  Social History   Socioeconomic History  . Marital status: Single    Spouse name: Not on file  . Number of children:  Not on file  . Years of education: Not on file  . Highest education level: Not on file  Occupational History  . Not on file  Tobacco Use  . Smoking status: Never Smoker  . Smokeless tobacco: Never Used  Vaping Use  . Vaping Use: Never used  Substance and Sexual Activity  . Alcohol use: Yes    Comment: socially  . Drug use: Yes    Types: Marijuana    Comment: socially  . Sexual activity: Not on file  Other Topics Concern  . Not on file  Social History Narrative  . Not on file   Social Determinants of Health   Financial Resource Strain: Not on file  Food Insecurity: Not on file  Transportation Needs: Not on file  Physical Activity: Not on file  Stress: Not on file  Social Connections: Not on file  Intimate Partner Violence: Not on file    SDOH:  SDOH Screenings   Alcohol Screen: Not on file  Depression (WSF6-8): Not on file  Financial Resource Strain: Not on file  Food Insecurity: Not on file  Housing: Not on file  Physical Activity: Not on file  Social Connections: Not on file  Stress: Not on file  Tobacco Use: Low Risk   . Smoking Tobacco Use: Never Smoker  . Smokeless Tobacco Use: Never Used  Transportation Needs: Not on file    Last Labs:  Admission on 06/13/2020  Component Date Value Ref Range Status  . POC Amphetamine UR 06/13/2020 None Detected  NONE DETECTED (Cut Off Level 1000 ng/mL) Final  . POC Secobarbital (BAR) 06/13/2020 None Detected  NONE DETECTED (Cut Off Level 300 ng/mL) Final  . POC Buprenorphine (BUP) 06/13/2020 None Detected  NONE DETECTED (Cut Off Level 10 ng/mL) Final  . POC Oxazepam (BZO) 06/13/2020 None Detected  NONE DETECTED (Cut Off Level 300 ng/mL) Final  . POC Cocaine UR 06/13/2020 None Detected  NONE DETECTED (Cut Off Level 300 ng/mL) Final  . POC Methamphetamine UR 06/13/2020 None Detected  NONE DETECTED (Cut Off Level 1000 ng/mL) Final  . POC Morphine 06/13/2020 None Detected  NONE DETECTED (Cut Off Level 300 ng/mL) Final  .  POC Oxycodone UR 06/13/2020 None Detected  NONE DETECTED (Cut Off Level 100 ng/mL) Final  . POC Methadone UR 06/13/2020 None Detected  NONE DETECTED (Cut Off Level 300 ng/mL) Final  . POC Marijuana UR 06/13/2020 None Detected  NONE DETECTED (Cut Off Level 50 ng/mL) Final    Allergies: Rizatriptan  PTA Medications: (Not in a hospital admission)     Recommendations  Based on my evaluation the patient does not appear to have an emergency medical condition.  Inpatient hospitalization. No beds available at [facility]. Patient in continuous assessment unit for safety.  Aldean Baker, NP 06/13/20  7:55 PM

## 2020-06-14 DIAGNOSIS — F332 Major depressive disorder, recurrent severe without psychotic features: Secondary | ICD-10-CM

## 2020-06-14 LAB — HEMOGLOBIN A1C
Hgb A1c MFr Bld: 4.6 % — ABNORMAL LOW (ref 4.8–5.6)
Mean Plasma Glucose: 85.32 mg/dL

## 2020-06-14 MED ORDER — TRAZODONE HCL 50 MG PO TABS: 50.0000 mg | ORAL_TABLET | Freq: Every evening | ORAL | 0 refills | Status: DC | PRN

## 2020-06-14 MED ORDER — CLONAZEPAM 0.5 MG PO TABS: 0.2500 mg | ORAL_TABLET | Freq: Every day | ORAL | Status: DC | PRN

## 2020-06-14 MED ORDER — VENLAFAXINE HCL ER 37.5 MG PO CP24: 37.5000 mg | ORAL_CAPSULE | Freq: Every day | ORAL | 0 refills | Status: DC

## 2020-06-14 MED ORDER — VENLAFAXINE HCL ER 37.5 MG PO CP24: 37.5000 mg | ORAL_CAPSULE | Freq: Every day | ORAL | Status: DC

## 2020-06-14 MED ORDER — CLONAZEPAM 0.5 MG PO TABS
ORAL_TABLET | ORAL | Status: AC
  Filled 2020-06-14: qty 1

## 2020-06-14 NOTE — ED Notes (Signed)
Educated on avs and medications. Verbalized understanding. Escorted to retrieve belongings. Ambulated per self. No SI, HI, AVH. No s/s pain, discomfort, or acute distress. A&O x4. Escorted to back [place] to d/c and transport to [facility] to pick up own vehicle. Stable at time of d/c

## 2020-06-14 NOTE — ED Notes (Signed)
Pt resting with eyes closed. Rise and fall of chest noted. No new issues noted at this time. Will continue to monitor for safety 

## 2020-06-14 NOTE — Discharge Instructions (Signed)

## 2020-06-14 NOTE — ED Provider Notes (Signed)
FBC/OBS ASAP Discharge Summary  Date and Time: 06/14/2020 10:32 AM  Name: Erika Durham  MRN:  [id number]   Discharge Diagnoses:  Final diagnoses:  MDD (major depressive disorder), recurrent severe, without psychosis (HCC)    Subjective: Patient reports today that she is feeling some better.  She denies having any suicidal homicidal ideations and denies any hallucinations.  Patient reports that she still feels depressed and feels that getting started on her medications and getting started back into treatment would be very beneficial to her.  She states that she does have a therapist, Erika Durham, in [place] that she sees on a regular basis but has not been prescribed any medications.  Patient was stating that she was restarted on Wellbutrin here however, she reports that she has a history of anorexia and states that she does not want to restart the medication because it suppresses her appetite.  Patient states that she lives at home with her significant other, Erika Durham, and that they can be contacted for collateral information. Erika Durham is contacted for collateral information at [phone].  There are no safety concerns with the patient discharging home today and there is no weapons reported to be in the house.  Stay Summary: Patient is a 25 year old female who presented to the [facility] due to worsening depressive symptoms with suicidal ideations to slit her wrists in the bathtub and she was transported to the [facility] for continuous observation.  Patient reports she been feeling more suicidal over the last 8 days.  Patient reports that she has had previous hospitalizations.  Patient was admitted to the continuous observation unit and was restarted on medications.  Today the patient is reporting that she is feeling better and that she feels that she is ready to go home.  Medications were changed to Effexor XR 37.5 mg p.o. daily and trazodone 50 mg p.o. nightly.  Patient requested to discontinue her  Wellbutrin because she has a history of anorexia.  Patient is also interested in outpatient services and was referred to [facility].  Patient was also provided with outpatient resource list.  Patient's medications were E prescribed to pharmacy of choice.  Collateral information and safety planning was established with patient's significant other.  Patient had her car at [facility] because she drove her self for evaluation and it was deemed that patient was safe to discharge back to her car and drive home.  Patient was provided with transportation via safe transport to her vehicle.  Patient had continued to deny any suicidal or homicidal ideations and denying hallucinations today.  Total Time spent with patient: 30 minutes  Past Psychiatric History: MDD, anxiety, previous hospitalization, previous suicide attempt Past Medical History:  Past Medical History:  Diagnosis Date  . Anxiety   . Depression   . Migraine   . Panic attack    History reviewed. No pertinent surgical history. Family History:  Family History  Problem Relation Age of Onset  . Healthy Mother   . Healthy Father    Family Psychiatric History: None reported Social History:  Social History   Substance and Sexual Activity  Alcohol Use Yes   Comment: socially     Social History   Substance and Sexual Activity  Drug Use Yes  . Types: Marijuana   Comment: socially    Social History   Socioeconomic History  . Marital status: Single    Spouse name: Not on file  . Number of children: Not on file  . Years of education: Not  on file  . Highest education level: Not on file  Occupational History  . Not on file  Tobacco Use  . Smoking status: Never Smoker  . Smokeless tobacco: Never Used  Vaping Use  . Vaping Use: Never used  Substance and Sexual Activity  . Alcohol use: Yes    Comment: socially  . Drug use: Yes    Types: Marijuana    Comment: socially  . Sexual activity: Not on file  Other Topics Concern  . Not on file   Social History Narrative  . Not on file   Social Determinants of Health   Financial Resource Strain: Not on file  Food Insecurity: Not on file  Transportation Needs: Not on file  Physical Activity: Not on file  Stress: Not on file  Social Connections: Not on file   SDOH:  SDOH Screenings   Alcohol Screen: Not on file  Depression (LEX5-1): Not on file  Financial Resource Strain: Not on file  Food Insecurity: Not on file  Housing: Not on file  Physical Activity: Not on file  Social Connections: Not on file  Stress: Not on file  Tobacco Use: Low Risk   . Smoking Tobacco Use: Never Smoker  . Smokeless Tobacco Use: Never Used  Transportation Needs: Not on file    Has this patient used any form of tobacco in the last 30 days? (Cigarettes, Smokeless Tobacco, Cigars, and/or Pipes) Prescription not provided because: Does not smoke  Current Medications:  Current Facility-Administered Medications  Medication Dose Route Frequency Provider Last Rate Last Admin  . acetaminophen (TYLENOL) tablet 650 mg  650 mg Oral Q6H PRN Aldean Baker, NP      . alum & mag hydroxide-simeth (MAALOX/MYLANTA) 200-200-20 MG/5ML suspension 30 mL  30 mL Oral Q4H PRN Aldean Baker, NP      . clonazePAM Scarlette Calico) tablet 0.25 mg  0.25 mg Oral Daily PRN Nira Conn A, NP      . hydrOXYzine (ATARAX/VISTARIL) tablet 25 mg  25 mg Oral TID PRN Aldean Baker, NP   25 mg at 06/14/20 0109  . magnesium hydroxide (MILK OF MAGNESIA) suspension 30 mL  30 mL Oral Daily PRN Aldean Baker, NP      . traZODone (DESYREL) tablet 50 mg  50 mg Oral QHS PRN Aldean Baker, NP   50 mg at 06/14/20 0109  . venlafaxine XR (EFFEXOR-XR) 24 hr capsule 37.5 mg  37.5 mg Oral Daily Laquanda Bick, Gerlene Burdock, FNP       Current Outpatient Medications  Medication Sig Dispense Refill  . clonazePAM (KLONOPIN) 0.5 MG tablet Take 0.25 mg by mouth daily as needed for anxiety.    . traZODone (DESYREL) 50 MG tablet Take 1 tablet (50 mg total) by mouth  at bedtime as needed for sleep. 30 tablet 0  . [START ON 06/15/2020] venlafaxine XR (EFFEXOR-XR) 37.5 MG 24 hr capsule Take 1 capsule (37.5 mg total) by mouth daily. 30 capsule 0    PTA Medications: (Not in a hospital admission)   Musculoskeletal  Strength & Muscle Tone: within normal limits Gait & Station: normal Patient leans: N/A  Psychiatric Specialty Exam  Presentation  General Appearance: Appropriate for Environment; Casual; Fairly Groomed  Eye Contact:Good  Speech:Clear and Coherent; Normal Rate  Speech Volume:Normal  Handedness:Right   Mood and Affect  Mood:Depressed  Affect:Appropriate; Congruent   Thought Process  Thought Processes:Coherent  Descriptions of Associations:Intact  Orientation:Full (Time, Place and Person)  Thought Content:WDL  Hallucinations:Hallucinations: None  Ideas of Reference:None  Suicidal Thoughts:Suicidal Thoughts: No SI Active Intent and/or Plan: With Plan; With Intent  Homicidal Thoughts:Homicidal Thoughts: No   Sensorium  Memory:Immediate Good; Recent Good; Remote Good  Judgment:Intact  Insight:Fair   Executive Functions  Concentration:Good  Attention Span:Good  Recall:Good  Fund of Knowledge:Good  Language:Good   Psychomotor Activity  Psychomotor Activity:Psychomotor Activity: Normal   Assets  Assets:Communication Skills; Desire for Improvement; Financial Resources/Insurance; Housing; Transportation; Social Support; Physical Health   Sleep  Sleep:Sleep: Good   Physical Exam  Physical Exam Vitals and nursing note reviewed.  Constitutional:      Appearance: She is well-developed.  HENT:     Head: Normocephalic.  Eyes:     Pupils: Pupils are equal, round, and reactive to light.  Cardiovascular:     Rate and Rhythm: Normal rate.  Pulmonary:     Effort: Pulmonary effort is normal.  Musculoskeletal:        General: Normal range of motion.  Neurological:     Mental Status: She is alert and  oriented to person, place, and time.    Review of Systems  Constitutional: Negative.   HENT: Negative.   Eyes: Negative.   Respiratory: Negative.   Cardiovascular: Negative.   Gastrointestinal: Negative.   Genitourinary: Negative.   Musculoskeletal: Negative.   Skin: Negative.   Neurological: Negative.   Endo/Heme/Allergies: Negative.   Psychiatric/Behavioral: Positive for depression. Negative for suicidal ideas.   Blood pressure 116/86, pulse 100, temperature 98.3 F (36.8 C), temperature source Oral, resp. rate 18, SpO2 100 %. There is no height or weight on file to calculate BMI.  Demographic Factors:  Adolescent or young adult, Caucasian and Gay, lesbian, or bisexual orientation  Loss Factors: NA  Historical Factors: Prior suicide attempts  Risk Reduction Factors:   Sense of responsibility to family, Employed, Living with another person, especially a relative and Positive social support  Continued Clinical Symptoms:  Previous Psychiatric Diagnoses and Treatments  Cognitive Features That Contribute To Risk:  None    Suicide Risk:  Mild:  Suicidal ideation of limited frequency, intensity, duration, and specificity.  There are no identifiable plans, no associated intent, mild dysphoria and related symptoms, good self-control (both objective and subjective assessment), few other risk factors, and identifiable protective factors, including available and accessible social support.  Plan Of Care/Follow-up recommendations:  Continue activity as tolerated. Continue diet as recommended by your PCP. Ensure to keep all appointments with outpatient providers.  Disposition: Discharge home to significant other  Maryfrances Bunnell, FNP 06/14/2020, 10:32 AM

## 2020-06-14 NOTE — ED Notes (Signed)
Pt sleeping at present, no distress noted, monitoring for safety. 

## 2020-06-20 ENCOUNTER — Telehealth (HOSPITAL_COMMUNITY): Payer: Self-pay | Admitting: Licensed Clinical Social Worker

## 2020-06-21 ENCOUNTER — Other Ambulatory Visit: Payer: Self-pay

## 2020-06-21 ENCOUNTER — Other Ambulatory Visit (HOSPITAL_COMMUNITY): Payer: 59 | Attending: Psychiatry | Admitting: Licensed Clinical Social Worker

## 2020-06-21 DIAGNOSIS — Z560 Unemployment, unspecified: Secondary | ICD-10-CM | POA: Insufficient documentation

## 2020-06-21 DIAGNOSIS — Z733 Stress, not elsewhere classified: Secondary | ICD-10-CM | POA: Insufficient documentation

## 2020-06-21 DIAGNOSIS — F332 Major depressive disorder, recurrent severe without psychotic features: Secondary | ICD-10-CM | POA: Insufficient documentation

## 2020-06-21 DIAGNOSIS — Z79899 Other long term (current) drug therapy: Secondary | ICD-10-CM | POA: Insufficient documentation

## 2020-06-21 DIAGNOSIS — Z9151 Personal history of suicidal behavior: Secondary | ICD-10-CM | POA: Insufficient documentation

## 2020-06-21 DIAGNOSIS — Z597 Insufficient social insurance and welfare support: Secondary | ICD-10-CM | POA: Insufficient documentation

## 2020-06-21 DIAGNOSIS — R4589 Other symptoms and signs involving emotional state: Secondary | ICD-10-CM | POA: Insufficient documentation

## 2020-06-21 DIAGNOSIS — Z638 Other specified problems related to primary support group: Secondary | ICD-10-CM | POA: Insufficient documentation

## 2020-06-21 DIAGNOSIS — F419 Anxiety disorder, unspecified: Secondary | ICD-10-CM | POA: Insufficient documentation

## 2020-06-21 DIAGNOSIS — R41844 Frontal lobe and executive function deficit: Secondary | ICD-10-CM | POA: Insufficient documentation

## 2020-06-22 ENCOUNTER — Other Ambulatory Visit: Payer: Self-pay

## 2020-06-22 ENCOUNTER — Encounter (HOSPITAL_COMMUNITY): Payer: Self-pay

## 2020-06-22 ENCOUNTER — Other Ambulatory Visit (HOSPITAL_COMMUNITY): Payer: 59 | Admitting: Licensed Clinical Social Worker

## 2020-06-22 ENCOUNTER — Other Ambulatory Visit (HOSPITAL_COMMUNITY): Payer: 59 | Admitting: Occupational Therapy

## 2020-06-22 DIAGNOSIS — F332 Major depressive disorder, recurrent severe without psychotic features: Secondary | ICD-10-CM

## 2020-06-22 DIAGNOSIS — Z560 Unemployment, unspecified: Secondary | ICD-10-CM | POA: Diagnosis not present

## 2020-06-22 DIAGNOSIS — R4589 Other symptoms and signs involving emotional state: Secondary | ICD-10-CM | POA: Diagnosis not present

## 2020-06-22 DIAGNOSIS — Z79899 Other long term (current) drug therapy: Secondary | ICD-10-CM | POA: Diagnosis not present

## 2020-06-22 DIAGNOSIS — R41844 Frontal lobe and executive function deficit: Secondary | ICD-10-CM | POA: Diagnosis not present

## 2020-06-22 DIAGNOSIS — Z638 Other specified problems related to primary support group: Secondary | ICD-10-CM | POA: Diagnosis not present

## 2020-06-22 DIAGNOSIS — Z9151 Personal history of suicidal behavior: Secondary | ICD-10-CM | POA: Diagnosis not present

## 2020-06-22 DIAGNOSIS — F419 Anxiety disorder, unspecified: Secondary | ICD-10-CM | POA: Diagnosis not present

## 2020-06-22 DIAGNOSIS — Z597 Insufficient social insurance and welfare support: Secondary | ICD-10-CM | POA: Diagnosis not present

## 2020-06-22 DIAGNOSIS — R45851 Suicidal ideations: Secondary | ICD-10-CM | POA: Diagnosis present

## 2020-06-22 DIAGNOSIS — Z733 Stress, not elsewhere classified: Secondary | ICD-10-CM | POA: Diagnosis not present

## 2020-06-22 NOTE — Therapy (Signed)
[facility] PARTIAL HOSPITALIZATION PROGRAM [address] [place], [place], [address] Phone: [phone]   Fax:  [phone] Virtual Visit via Video Note  I connected with Erika Durham on 06/22/20 at  11:00 AM EST by a video enabled telemedicine application and verified that I am speaking with the correct person using two identifiers.  Location: Patient: Patient Home Provider: Clinic Office   I discussed the limitations of evaluation and management by telemedicine and the availability of in person appointments. The patient expressed understanding and agreed to proceed.   I discussed the assessment and treatment plan with the patient. The patient was provided an opportunity to ask questions and all were answered. The patient agreed with the plan and demonstrated an understanding of the instructions.   The patient was advised to call back or seek an in-person evaluation if the symptoms worsen or if the condition fails to improve as anticipated.  I provided 80 minutes of non-face-to-face time during this encounter. 20 minutes OT Evaluation 60 minutes OT Group Therapy   Donne Hazel, OT   Occupational Therapy Evaluation  Patient Details  Name: Erika Durham MRN: [id number] Date of Birth: [birth year] Referring Provider (OT): Hillery Jacks   Encounter Date: 06/22/2020   OT End of Session - 06/22/20 1221    Visit Number 1    Number of Visits 20    Date for OT Re-Evaluation 07/20/20    Authorization Type [employer]    OT Start Time 1105   OT Eval [phone]   OT Stop Time 1205    OT Time Calculation (min) 60 min    Activity Tolerance Patient tolerated treatment well    Behavior During Therapy [facility] for tasks assessed/performed           Past Medical History:  Diagnosis Date  . Anxiety   . Depression   . Migraine   . Panic attack     History reviewed. No pertinent surgical history.  There were no vitals filed for this visit.   Subjective  Assessment - 06/22/20 1219    Currently in Pain? No/denies             [facility] OT Assessment - 06/22/20 0001      Assessment   Medical Diagnosis Major depression    Referring Provider (OT) Hillery Jacks      Precautions   Precautions None      Balance Screen   Has the patient fallen in the past 6 months No    Has the patient had a decrease in activity level because of a fear of falling?  No    Is the patient reluctant to leave their home because of a fear of falling?  No            OT Education - 06/22/20 1219    Education Details Educated on OT within [facility] programming in addition to different communication styles and identified strategies/tips to practice being more assertive    Person(s) Educated Patient    Methods Explanation;Handout    Comprehension Verbalized understanding            OT Short Term Goals - 06/22/20 1222      OT SHORT TERM GOAL #1   Title Pt will actively engage in OT group sessions throughout duration of PHP programming, in order to promote daily structure, social engagement, and opportunities to develop and utilize adaptive strategies to maximize functional performance in preparation for safe transition and integration back into school, work,  and the community.    Time 4    Period Weeks    Status New    Target Date 07/20/20      OT SHORT TERM GOAL #2   Title Pt will practice and identify 1-3 adaptive coping strategies she can utilize, in order to safely manage increased depression/anxiety, without resulting in panic/anxiety attack, with min cues, in preparation for safe and healthy reintegration back into the community at discharge.    Time 4    Period Weeks    Status New    Target Date 07/20/20      OT SHORT TERM GOAL #3   Title Pt will identify and implement 1-3 sleep hygiene strategies she can utilize, in order to improve sleep quality/ADL performance, in preparation for safe and healthy reintegration back into the community at discharge.     Time 4    Period Weeks    Status New    Target Date 07/20/20      OT SHORT TERM GOAL #4   Title Pt will identify 1-3 ways to structure her free time, in order to promote re-engagement in preferred leisure interests and establishment of a daily routine, in preparation for community reintegration.    Time 4    Period Weeks    Status New    Target Date 07/20/20         Occupational Therapy Assessment 06/22/2020  Erika Durham is a 25 y/o female with PMHx of major depression, anxiety, and anorexia who was referred to [facility] from the [facility] with reports of increased depressive symptoms including SI, anxiety, and panic attacks. Pt reports several increased psychosocial stressors including recently quitting her job at [employer] and no longer having a source of income, falling behind in last semester of college/failing, and a long stretch of time in which she has been off medications to manage her mental health. Pt reports a desire to engage in PHP programming in order to manage identified stressors and to engage meaningfully in identified areas of occupation and ADL/iADLs. Upon approach, pt presents as depressed, sullen, soft spoken, however cooperative and forthcoming with information. Pt was present with camera and microphone on for duration of group. Pt reports enjoying video games and reading and identifies goal for admission "feel better".   Precautions/Limitations: None noted/observed  Cognition: Appears intact    Visual Motor: Pt present in virtual visit with glasses; pt reports daily use for visual deficits   Living Situation: Pt lives in an apartment with her girlfriend  School/Work: Pt recently quit her job at [employer], reports it was "too stressful" and is currently unemployed without a source of income. 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 Pt reports desire to "get school over with" and obtain degree,  denies interest in pursuing career around religious studies.   ADL/iADL Performance: Pt reports difficulty showering and brushing her teeth "some days" and reports giving herself 'bed baths' in which she lays in the tub and cleans her underarms and 'hot spot' areas with a wet washcloth and soap   Leisure Interests and Hobbies: Enjoys video games and previously enjoyed reading (not able to focus enough currently)  Social Support: Identifies support from [place], younger sister, and best friend. Pt reports being in contact with Mom, however denies close connection and does not see Mom as a source of support   What do you do when you are very stressed,  angry, upset, sad or anxious? Isolate from others, Cry, Talk to someone, Sleep and Write in a journal   What helps when you are not feeling well? Deep breathing, Additional/Extra medication, Taking a shower or bath, Having a warm or cool drink and Pacing the hall  What are some things that make it MORE difficult for you when you are already upset? Being touched, Noise (in general), Not being able to express my opinion, Loud noises, People staring at me, Being criticized and Particular time of year  Is there anything specific that you would like help with while you're in the partial hospitalization program? Coping Skills  What is your goal while you are here?  "Feel better, catch up in school, and find medication that works"  Assessment: Pt demonstrates behavior that inhibits/restricts participation in occupation and would benefit from skilled occupational therapy services to address current difficulties with symptom management, emotion regulation, socialization, stress management, time management, job readiness, financial wellness, health and nutrition, sleep hygiene, ADL/iADL performance and leisure participation, in preparation for reintegration and return to community at discharge.   Plan: Pt will participate in skilled occupational therapy sessions  (group and/or individual) in order to promote daily structure, social engagement, and opportunities to develop and utilize adaptive strategies to maximize functional performance in preparation for safe transition and integration back into school, work, and/or the community at discharge. OT sessions will occur 4-5 x per week for 2-4 weeks.   Donne Hazelassidy Kadon Andrus, MOT, OTR/L  Group Session:  S: "I've been realizing more about myself and I think I have started practicing and trying to strengthen my assertiveness more recently."  O: Today's group focused on topic of Communication Styles. Group members were educated on the different styles including passive, aggressive, and assertive communication. Members shared and reflected on which style they most often find themselves communicating in and how to transition to a more assertive approach.   A: Erika Durham was active in her participation of discussion and was present throughout today's session. Pt shared that she has recently become more aware of her communication and is actively working on strengthening and becoming more assertive. She appeared receptive to education received during session and also appeared to nod along in relation to other peer's discussions and contributions.   P: Continue to attend PHP OT group sessions 5x week for 4 weeks to promote daily structure, social engagement, and opportunities to develop and utilize adaptive strategies to maximize functional performance in preparation for safe transition and integration back into school, work, and the community. Plan to address topic of assertiveness in next OT group session.    Plan - 06/22/20 1221    Clinical Impression Statement Erika Durham is a 25 y/o female with PMHx of major depression, anxiety, and anorexia who [facility] with reports of increased depressive symptoms including SI, anxiety, and panic attacks. Pt reports several increased psychosocial stressors including  recently quitting her job at [employer] and no longer having a source of income, falling behind in last semester of college/failing, and a long stretch of time in which she has been off medications to manage her mental health. Pt reports a desire to engage in PHP programming in order to manage identified stressors and to engage meaningfully in identified areas of occupation and ADL/iADLs.    OT Occupational Profile and History Problem Focused Assessment - Including review of records relating to presenting problem    Occupational performance deficits (Please refer to evaluation for details): ADL's;IADL's;Rest and  Sleep;Education;Work;Leisure;Social Participation    Body Structure / Function / Physical Skills ADL    Cognitive Skills Attention;Emotional;Energy/Drive;Learn;Memory;Perception;Problem Solve;Safety Awareness;Temperament/Personality;Thought;Understand    Psychosocial Skills Coping Strategies;Environmental  Adaptations;Habits;Interpersonal Interaction;Routines and Behaviors    Rehab Potential Good    Clinical Decision Making Limited treatment options, no task modification necessary    Comorbidities Affecting Occupational Performance: May have comorbidities impacting occupational performance    Modification or Assistance to Complete Evaluation  No modification of tasks or assist necessary to complete eval    OT Frequency 5x / week    OT Duration 4 weeks    OT Treatment/Interventions Self-care/ADL training;Patient/family education;Coping strategies training;Psychosocial skills training    Consulted and Agree with Plan of Care Patient           Patient will benefit from skilled therapeutic intervention in order to improve the following deficits and impairments:   Body Structure / Function / Physical Skills: ADL Cognitive Skills: Attention,Emotional,Energy/Drive,Learn,Memory,Perception,Problem Solve,Safety Awareness,Temperament/Personality,Thought,Understand Psychosocial Skills: Coping  Strategies,Environmental  Adaptations,Habits,Interpersonal Interaction,Routines and Behaviors   Visit Diagnosis: Difficulty coping  Frontal lobe and executive function deficit  Severe episode of recurrent major depressive disorder, without psychotic features (HCC)    Problem List There are no problems to display for this patient.   Rodman Pickle Sean Malinowski 06/22/2020, 12:24 PM  [facility] PARTIAL HOSPITALIZATION PROGRAM [address] [place], [place], [address] Phone: [phone]   Fax:  [phone]  Name: Leslee Suire MRN: [id number] Date of Birth: November 14, 1995

## 2020-06-23 ENCOUNTER — Encounter (HOSPITAL_COMMUNITY): Payer: Self-pay

## 2020-06-23 ENCOUNTER — Other Ambulatory Visit (HOSPITAL_COMMUNITY): Payer: 59 | Admitting: Licensed Clinical Social Worker

## 2020-06-23 ENCOUNTER — Other Ambulatory Visit (HOSPITAL_COMMUNITY): Payer: 59 | Admitting: Occupational Therapy

## 2020-06-23 ENCOUNTER — Other Ambulatory Visit: Payer: Self-pay

## 2020-06-23 DIAGNOSIS — F332 Major depressive disorder, recurrent severe without psychotic features: Secondary | ICD-10-CM | POA: Diagnosis not present

## 2020-06-23 DIAGNOSIS — R41844 Frontal lobe and executive function deficit: Secondary | ICD-10-CM

## 2020-06-23 DIAGNOSIS — R4589 Other symptoms and signs involving emotional state: Secondary | ICD-10-CM

## 2020-06-23 NOTE — Therapy (Signed)
[facility] PARTIAL HOSPITALIZATION PROGRAM [address] [place], [place], [address] Phone: [phone]   Fax:  [phone] Virtual Visit via Video Note  I connected with Erika Durham on 06/23/20 at  11:00 AM EST by a video enabled telemedicine application and verified that I am speaking with the correct person using two identifiers.  Location: Patient: Patient Home Provider: Clinic Office   I discussed the limitations of evaluation and management by telemedicine and the availability of in person appointments. The patient expressed understanding and agreed to proceed.    I discussed the assessment and treatment plan with the patient. The patient was provided an opportunity to ask questions and all were answered. The patient agreed with the plan and demonstrated an understanding of the instructions.   The patient was advised to call back or seek an in-person evaluation if the symptoms worsen or if the condition fails to improve as anticipated.  I provided 60 minutes of non-face-to-face time during this encounter.   Donne Hazel, OT   Occupational Therapy Treatment  Patient Details  Name: Erika Durham MRN: [id number] Date of Birth: [birth year] Referring Provider (OT): Hillery Jacks   Encounter Date: 06/23/2020   OT End of Session - 06/23/20 1400    Visit Number 2    Number of Visits 20    Date for OT Re-Evaluation 07/20/20    Authorization Type [employer]    OT Start Time 1105    OT Stop Time 1205    OT Time Calculation (min) 60 min    Activity Tolerance Patient tolerated treatment well    Behavior During Therapy [facility] for tasks assessed/performed           Past Medical History:  Diagnosis Date  . Anxiety   . Depression   . Migraine   . Panic attack     Past Surgical History:  Procedure Laterality Date  . WISDOM TOOTH EXTRACTION      There were no vitals filed for this visit.   Subjective Assessment - 06/23/20 1359     Currently in Pain? No/denies                                OT Education - 06/23/20 1400    Education Details Educated on different communication styles with strategies to become more assertive with use of XYZ communication tool    Person(s) Educated Patient    Methods Explanation;Handout    Comprehension Verbalized understanding            OT Short Term Goals - 06/23/20 1400      OT SHORT TERM GOAL #1   Status On-going      OT SHORT TERM GOAL #2   Status On-going      OT SHORT TERM GOAL #3   Status On-going      OT SHORT TERM GOAL #4   Status On-going          Group Session:  S: None noted  O: Group began with a reflection from previous OT session focused on communication styles and group members re-iterated what was learned during previous session. Members shared and reflected on any opportunities they were presented with last evening to practice their assertiveness skills or recognize patterns of communication observed. Today's group focused on assertiveness skills training and use of the XYZ* assertive communication tool was introduced. The XYZ communication tool states: I feel X when you do  Y in situation Z and I would like _________. X is the emotion, Y is the specific behavior, and Z is the specific situation. Group members each formulated their own XYZ statement and shared with the group to discuss and offer feedback. Additional tips and strategies to practice being assertive were also introduced and discussed.   A: Aolanis was present in group space and session, however was largely an observer and appeared to listen actively in peer's discussion. Pt did not contribute to discussion, however was observed nodding along in agreement with statements being made and education being shared. Appeared receptive to education on increasing assertiveness and practicing use of XYZ formula.   P: Continue to attend PHP OT group sessions 5x week for 4 weeks to  promote daily structure, social engagement, and opportunities to develop and utilize adaptive strategies to maximize functional performance in preparation for safe transition and integration back into school, work, and the community.    Plan - 06/23/20 1400    Occupational performance deficits (Please refer to evaluation for details): ADL's;IADL's;Rest and Sleep;Education;Work;Leisure;Social Participation    Body Structure / Function / Physical Skills ADL    Cognitive Skills Attention;Emotional;Energy/Drive;Learn;Memory;Perception;Problem Solve;Safety Awareness;Temperament/Personality;Thought;Understand    Psychosocial Skills Coping Strategies;Environmental  Adaptations;Habits;Interpersonal Interaction;Routines and Behaviors           Patient will benefit from skilled therapeutic intervention in order to improve the following deficits and impairments:   Body Structure / Function / Physical Skills: ADL Cognitive Skills: Attention,Emotional,Energy/Drive,Learn,Memory,Perception,Problem Solve,Safety Awareness,Temperament/Personality,Thought,Understand Psychosocial Skills: Coping Strategies,Environmental  Adaptations,Habits,Interpersonal Interaction,Routines and Behaviors   Visit Diagnosis: Difficulty coping  Frontal lobe and executive function deficit  Severe episode of recurrent major depressive disorder, without psychotic features (HCC)    Problem List There are no problems to display for this patient.   06/23/2020  Donne Hazel, MOT, OTR/L  06/23/2020, 2:01 PM  [facility] HOSPITALIZATION PROGRAM [address] [place], [place], [address] Phone: [phone]   Fax:  [phone]  Name: Erika Durham MRN: [id number] Date of Birth: 03/27/1996

## 2020-06-23 NOTE — Progress Notes (Signed)
Spoke with patient via Webex video call, used 2 identifiers to correctly identify patient. States she went to urgent care 06/13/20 for suicidal thoughts, depression, anxiety, and self harm. Also diagnosed with anorexia. They recommended PHP. She did a program at [facility] a year ago. Has been cutting on her arms and legs since high school. Has brain fog in the mornings, started Effexor last week. No side effects noted. Denies SI/HI or AV hallucinations. On scale 1-10 as 10 being worst she rates depression at 6 and anxiety at 7. Asking for a Klonopin refill to help her when she has a panic attack. PHQ9=23. No issues or complaints.

## 2020-06-26 ENCOUNTER — Other Ambulatory Visit: Payer: Self-pay

## 2020-06-26 ENCOUNTER — Other Ambulatory Visit (HOSPITAL_COMMUNITY): Payer: 59 | Admitting: Licensed Clinical Social Worker

## 2020-06-26 ENCOUNTER — Other Ambulatory Visit (HOSPITAL_COMMUNITY): Payer: 59 | Admitting: Occupational Therapy

## 2020-06-26 ENCOUNTER — Encounter (HOSPITAL_COMMUNITY): Payer: Self-pay

## 2020-06-26 DIAGNOSIS — R41844 Frontal lobe and executive function deficit: Secondary | ICD-10-CM

## 2020-06-26 DIAGNOSIS — R4589 Other symptoms and signs involving emotional state: Secondary | ICD-10-CM

## 2020-06-26 DIAGNOSIS — F332 Major depressive disorder, recurrent severe without psychotic features: Secondary | ICD-10-CM

## 2020-06-26 NOTE — Therapy (Signed)
[facility] PARTIAL HOSPITALIZATION PROGRAM [address] [place], [place], [address] Phone: [phone]   Fax:  [phone] Virtual Visit via Video Note  I connected with Erika Durham on 06/26/20 at  11:00 AM EST by a video enabled telemedicine application and verified that I am speaking with the correct person using two identifiers.  Location: Patient: Patient Home Provider: Clinic Office   I discussed the limitations of evaluation and management by telemedicine and the availability of in person appointments. The patient expressed understanding and agreed to proceed.   I discussed the assessment and treatment plan with the patient. The patient was provided an opportunity to ask questions and all were answered. The patient agreed with the plan and demonstrated an understanding of the instructions.   The patient was advised to call back or seek an in-person evaluation if the symptoms worsen or if the condition fails to improve as anticipated.  I provided 65 minutes of non-face-to-face time during this encounter.   Donne Hazel, OT   Occupational Therapy Treatment  Patient Details  Name: Erika Durham MRN: [id number] Date of Birth: 1996-01-28 Referring Provider (OT): Hillery Jacks   Encounter Date: 06/26/2020   OT End of Session - 06/26/20 1258    Visit Number 3    Number of Visits 20    Date for OT Re-Evaluation 07/20/20    Authorization Type [employer]    OT Start Time 1115    OT Stop Time 1220    OT Time Calculation (min) 65 min    Activity Tolerance Patient tolerated treatment well    Behavior During Therapy [facility] for tasks assessed/performed           Past Medical History:  Diagnosis Date  . Anxiety   . Depression   . Migraine   . Panic attack     Past Surgical History:  Procedure Laterality Date  . WISDOM TOOTH EXTRACTION      There were no vitals filed for this visit.   Subjective Assessment - 06/26/20 1258    Currently  in Pain? No/denies           OT Education - 06/26/20 1258    Education Details Educated on the 5 F's and provided resources/strategies/tips to improve overall health and wellness    Person(s) Educated Patient    Methods Explanation;Handout    Comprehension Verbalized understanding            OT Short Term Goals - 06/23/20 1400      OT SHORT TERM GOAL #1   Status On-going      OT SHORT TERM GOAL #2   Status On-going      OT SHORT TERM GOAL #3   Status On-going      OT SHORT TERM GOAL #4   Status On-going         Group Session:  S: "I think I was assertive over the weekend with my mom on the phone, setting boundaries."  O: Today's group session focused on the topic of health and wellness as it relates to the impact on mental health. Discussion focused on identifying the 5 F's to wellness including Food, Fitness, Fresh air, Fellowship, and Friendship with self and soul. Group members identified areas of wellness that they would like to improve upon and were educated and offered various resources. Discussion also focused on how the food we eat impacts our mental health, along with the benefits of engaging in physical activity/exercise and getting outside for  fresh air. Discussion wrapped up with group members identifying one area of wellness they could improve upon and identified a strategy to do so.    A: [name] was relatively quiet and more withdrawn in today's session, was noted with camera off for the majority of group, however verbally engaged when prompted. Pt shared that she was able to practice being assertive over the weekend with her mother and shared the example with peers. Pt was receptive to education received during today's session focused on overall wellness and expressed personal goal of needing to improve and work most on fellowship. She shared that it is very difficult for her to get out of the house and interact with people outside of the household.   P:  Continue to attend PHP OT group sessions 5x week for 4 weeks to promote daily structure, social engagement, and opportunities to develop and utilize adaptive strategies to maximize functional performance in preparation for safe transition and integration back into school, work, and the community. Plan to address topic of self-care in next OT group session.   Plan - 06/26/20 1259    Occupational performance deficits (Please refer to evaluation for details): ADL's;IADL's;Rest and Sleep;Education;Work;Leisure;Social Participation    Body Structure / Function / Physical Skills ADL    Cognitive Skills Attention;Emotional;Energy/Drive;Learn;Memory;Perception;Problem Solve;Safety Awareness;Temperament/Personality;Thought;Understand    Psychosocial Skills Coping Strategies;Environmental  Adaptations;Habits;Interpersonal Interaction;Routines and Behaviors           Patient will benefit from skilled therapeutic intervention in order to improve the following deficits and impairments:   Body Structure / Function / Physical Skills: ADL Cognitive Skills: Attention,Emotional,Energy/Drive,Learn,Memory,Perception,Problem Solve,Safety Awareness,Temperament/Personality,Thought,Understand Psychosocial Skills: Coping Strategies,Environmental  Adaptations,Habits,Interpersonal Interaction,Routines and Behaviors   Visit Diagnosis: Difficulty coping  Frontal lobe and executive function deficit  Severe episode of recurrent major depressive disorder, without psychotic features (HCC)    Problem List There are no problems to display for this patient.   06/26/2020  Donne Hazel, MOT, OTR/L  06/26/2020, 12:59 PM  [facility] HOSPITALIZATION PROGRAM [address] [place], [place], [address] Phone: [phone]   Fax:  [phone]  Name: Erika Durham MRN: [id number] Date of Birth: [birth year]

## 2020-06-27 ENCOUNTER — Encounter (HOSPITAL_COMMUNITY): Payer: Self-pay | Admitting: Family

## 2020-06-27 ENCOUNTER — Encounter (HOSPITAL_COMMUNITY): Payer: Self-pay

## 2020-06-27 ENCOUNTER — Other Ambulatory Visit (HOSPITAL_COMMUNITY): Payer: 59 | Admitting: Licensed Clinical Social Worker

## 2020-06-27 ENCOUNTER — Other Ambulatory Visit (HOSPITAL_COMMUNITY): Payer: 59 | Admitting: Occupational Therapy

## 2020-06-27 ENCOUNTER — Other Ambulatory Visit: Payer: Self-pay

## 2020-06-27 DIAGNOSIS — F332 Major depressive disorder, recurrent severe without psychotic features: Secondary | ICD-10-CM

## 2020-06-27 DIAGNOSIS — R4589 Other symptoms and signs involving emotional state: Secondary | ICD-10-CM

## 2020-06-27 DIAGNOSIS — R41844 Frontal lobe and executive function deficit: Secondary | ICD-10-CM

## 2020-06-27 MED ORDER — VENLAFAXINE HCL ER 75 MG PO CP24: 75.0000 mg | ORAL_CAPSULE | Freq: Every day | ORAL | 2 refills | Status: DC

## 2020-06-27 MED ORDER — VENLAFAXINE HCL ER 75 MG PO CP24: 150.0000 mg | ORAL_CAPSULE | Freq: Every day | ORAL | 2 refills | Status: DC

## 2020-06-27 NOTE — Progress Notes (Signed)
Spoke with patient via Webex video call, used 2 identifiers to correctly identify patient. She states that its hard to connect in groups. Yesterday she did not identify with anything being talked about. Only as 3 Klonopin left and needs a refill, she states she does not have any on file that she is aware of. Has a lot of anxiety today. Talking with her therapist and family about attending a residential treatment center in [place] but has a lot of questions that need answered. She is planning to call the center today and discuss her concerns. Denies SI/HI or AV hallucinations. On scale 1-10 as 10 being worst she rates depression at 8 and anxiety at 10. No side effects from medications. No other issues or complaints.
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
## 2020-06-27 NOTE — Therapy (Signed)
[facility] PARTIAL HOSPITALIZATION PROGRAM [address] [place], [place], [address] Phone: [phone]   Fax:  [phone] Virtual Visit via Video Note  I connected with Erika Durham on 06/27/20 at  11:00 AM EST by a video enabled telemedicine application and verified that I am speaking with the correct person using two identifiers.  Location: Patient: Patient Home Provider: Clinic Office   I discussed the limitations of evaluation and management by telemedicine and the availability of in person appointments. The patient expressed understanding and agreed to proceed.   I discussed the assessment and treatment plan with the patient. The patient was provided an opportunity to ask questions and all were answered. The patient agreed with the plan and demonstrated an understanding of the instructions.   The patient was advised to call back or seek an in-person evaluation if the symptoms worsen or if the condition fails to improve as anticipated.  I provided 60 minutes of non-face-to-face time during this encounter.   Donne Hazel, OT   Occupational Therapy Treatment  Patient Details  Name: Erika Durham MRN: [id number] Date of Birth: 1995/11/28 Referring Provider (OT): Hillery Jacks   Encounter Date: 06/27/2020   OT End of Session - 06/27/20 1438    Visit Number 4    Number of Visits 20    Date for OT Re-Evaluation 07/20/20    Authorization Type [employer]et[employer]    OT Start Time 1100    OT Stop Time 1200    OT Time Calculation (min) 60 min    Activity Tolerance Patient tolerated treatment well    Behavior During Therapy [facility] for tasks assessed/performed           Past Medical History:  Diagnosis Date  . Anxiety   . Depression   . Migraine   . Panic attack     Past Surgical History:  Procedure Laterality Date  . WISDOM TOOTH EXTRACTION      There were no vitals filed for this visit.   Subjective Assessment - 06/27/20 1438    Currently  in Pain? No/denies            OT Education - 06/27/20 1438    Education Details Educated on sleep hygiene and strategies to improve overall sleep quality    Person(s) Educated Patient    Methods Explanation;Handout    Comprehension Verbalized understanding            OT Short Term Goals - 06/23/20 1400      OT SHORT TERM GOAL #1   Status On-going      OT SHORT TERM GOAL #2   Status On-going      OT SHORT TERM GOAL #3   Status On-going      OT SHORT TERM GOAL #4   Status On-going         Group Session:  S: "I have trouble with all of those things, getting quality sleep, sleeping too much or too little, being restless, everything."  O: Today's group discussion focused on topic of Sleep Hygiene. Patients reflected on the quality of sleep they typically receive and identified areas that need improvement. Group was given background information on sleep and sleep hygiene, including common sleep disorders. Group members also received information on how to improve one's sleep and introduced a sleep diary as a tool that can be utilized to track sleep quality over a length of time. Group session ended with patients identifying one or more strategies they could utilize or  implement into their sleep routine in order to improve overall sleep quality.    A: Erika Durham was active in her participation of discussion and shared that she struggles with all aspects of her sleep including periods in which she sleeps too much or too little. Pt able to relate to peers in similar concerns and appeared receptive to strategies and education provided on improving sleep patterns.   P: Continue to attend PHP OT group sessions 5x week for 3 weeks to promote daily structure, social engagement, and opportunities to develop and utilize adaptive strategies to maximize functional performance in preparation for safe transition and integration back into school, work, and the community.    Plan - 06/27/20 1439     Occupational performance deficits (Please refer to evaluation for details): ADL's;IADL's;Rest and Sleep;Education;Work;Leisure;Social Participation    Body Structure / Function / Physical Skills ADL    Cognitive Skills Attention;Emotional;Energy/Drive;Learn;Memory;Perception;Problem Solve;Safety Awareness;Temperament/Personality;Thought;Understand    Psychosocial Skills Coping Strategies;Environmental  Adaptations;Habits;Interpersonal Interaction;Routines and Behaviors           Patient will benefit from skilled therapeutic intervention in order to improve the following deficits and impairments:   Body Structure / Function / Physical Skills: ADL Cognitive Skills: Attention,Emotional,Energy/Drive,Learn,Memory,Perception,Problem Solve,Safety Awareness,Temperament/Personality,Thought,Understand Psychosocial Skills: Coping Strategies,Environmental  Adaptations,Habits,Interpersonal Interaction,Routines and Behaviors   Visit Diagnosis: Difficulty coping  Frontal lobe and executive function deficit  Severe episode of recurrent major depressive disorder, without psychotic features (HCC)    Problem List There are no problems to display for this patient.   06/27/2020  Donne Hazel, MOT, OTR/L  06/27/2020, 2:39 PM  [facility] HOSPITALIZATION PROGRAM [address] [place], [place], [address] Phone: [phone]   Fax:  [phone]  Name: Erika Durham MRN: [id number] Date of Birth: October 12, 1995

## 2020-06-28 ENCOUNTER — Other Ambulatory Visit (HOSPITAL_COMMUNITY): Payer: 59

## 2020-06-28 ENCOUNTER — Other Ambulatory Visit: Payer: Self-pay

## 2020-06-28 ENCOUNTER — Telehealth (HOSPITAL_COMMUNITY): Payer: Self-pay | Admitting: Licensed Clinical Social Worker
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
## 2020-06-29 ENCOUNTER — Telehealth (HOSPITAL_COMMUNITY): Payer: Self-pay | Admitting: Licensed Clinical Social Worker

## 2020-06-29 ENCOUNTER — Other Ambulatory Visit (HOSPITAL_COMMUNITY): Payer: 59

## 2020-06-29 ENCOUNTER — Other Ambulatory Visit: Payer: Self-pay

## 2020-06-30 ENCOUNTER — Other Ambulatory Visit (HOSPITAL_COMMUNITY): Payer: 59 | Admitting: Licensed Clinical Social Worker

## 2020-06-30 ENCOUNTER — Other Ambulatory Visit: Payer: Self-pay

## 2020-06-30 ENCOUNTER — Other Ambulatory Visit (HOSPITAL_COMMUNITY): Payer: 59 | Admitting: Occupational Therapy

## 2020-06-30 ENCOUNTER — Encounter (HOSPITAL_COMMUNITY): Payer: Self-pay

## 2020-06-30 DIAGNOSIS — R4589 Other symptoms and signs involving emotional state: Secondary | ICD-10-CM

## 2020-06-30 DIAGNOSIS — F332 Major depressive disorder, recurrent severe without psychotic features: Secondary | ICD-10-CM

## 2020-06-30 DIAGNOSIS — R41844 Frontal lobe and executive function deficit: Secondary | ICD-10-CM

## 2020-06-30 NOTE — Therapy (Signed)
[facility] PARTIAL HOSPITALIZATION PROGRAM [address] [place], [place], [address] Phone: [phone]   Fax:  [phone] Virtual Visit via Video Note  I connected with Erika [place] on 06/30/20 at  11:00 AM EST by a video enabled telemedicine application and verified that I am speaking with the correct person using two identifiers.  Location: Patient: Patient Home Provider: Clinic Office    I discussed the limitations of evaluation and management by telemedicine and the availability of in person appointments. The patient expressed understanding and agreed to proceed.  I discussed the assessment and treatment plan with the patient. The patient was provided an opportunity to ask questions and all were answered. The patient agreed with the plan and demonstrated an understanding of the instructions.   The patient was advised to call back or seek an in-person evaluation if the symptoms worsen or if the condition fails to improve as anticipated.  I provided 60 minutes of non-face-to-face time during this encounter.   Erika [place], OT   Occupational Therapy Treatment  Patient Details  Name: Erika [place] MRN: [id number] Date of Birth: Feb 28, 1996 Referring Provider (OT): Erika [place]   Encounter Date: 06/30/2020   OT End of Session - 06/30/20 1221    Visit Number 5    Number of Visits 20    Date for OT Re-Evaluation 07/20/20    Authorization Type [employer]    OT Start Time 1100    OT Stop Time 1200    OT Time Calculation (min) 60 min    Activity Tolerance Patient tolerated treatment well    Behavior During Therapy [facility] for tasks assessed/performed           Past Medical History:  Diagnosis Date  . Anxiety   . Depression   . Migraine   . Panic attack     Past Surgical History:  Procedure Laterality Date  . WISDOM TOOTH EXTRACTION      There were no vitals filed for this visit.   Subjective Assessment - 06/30/20 1221    Currently  in Pain? No/denies                                OT Education - 06/30/20 1221    Education Details Educated on physical symptomology of stress and its effects on the body, along with positive stress management tips/strategies    Person(s) Educated Patient    Methods Explanation;Handout    Comprehension Verbalized understanding            OT Short Term Goals - 06/23/20 1400      OT SHORT TERM GOAL #1   Status On-going      OT SHORT TERM GOAL #2   Status On-going      OT SHORT TERM GOAL #3   Status On-going      OT SHORT TERM GOAL #4   Status On-going         Group Session:  S: "I have to see my mom this weekend at my sister's birthday party, and I don't like that woman, no one does."  O: Group began with a check-in and review of previous OT session focused on relaxation strategies. Today's discussion focused on the topic of stress management. Group members worked collaboratively to create a Microbiologist identifying physical signs, behavioral signs, emotional/psychological, and cognitive signs of stress. Discussion then focused and encouraged group members to identify positive stress  management strategies they could utilize in those moments to manage identified signs.   A: Erika Durham was active in her participation of discussion, sharing that she is currently stressed about having to see her mom this weekend. She shared that she has avoided it for the last three weeks, however has a birthday party for her sister that she has to go to and her mom will be there. She shared she cannot avoid her, however feels more comfortable that it is in a neutral space  (not at her or her mom's house) so she can manage the stressor by leaving or walking away if she needs too. She appeared receptive to alternative strategies and education provided by clinician and other group members with similar stressors (specific people).   P: Continue to attend PHP OT group  sessions 5x week for 4 weeks to promote daily structure, social engagement, and opportunities to develop and utilize adaptive strategies to maximize functional performance in preparation for safe transition and integration back into school, work, and the community.    Plan - 06/30/20 1222    Occupational performance deficits (Please refer to evaluation for details): ADL's;IADL's;Rest and Sleep;Education;Work;Leisure;Social Participation    Body Structure / Function / Physical Skills ADL    Cognitive Skills Attention;Emotional;Energy/Drive;Learn;Memory;Perception;Problem Solve;Safety Awareness;Temperament/Personality;Thought;Understand    Psychosocial Skills Coping Strategies;Environmental  Adaptations;Habits;Interpersonal Interaction;Routines and Behaviors           Patient will benefit from skilled therapeutic intervention in order to improve the following deficits and impairments:   Body Structure / Function / Physical Skills: ADL Cognitive Skills: Attention,Emotional,Energy/Drive,Learn,Memory,Perception,Problem Solve,Safety Awareness,Temperament/Personality,Thought,Understand Psychosocial Skills: Coping Strategies,Environmental  Adaptations,Habits,Interpersonal Interaction,Routines and Behaviors   Visit Diagnosis: Difficulty coping  Frontal lobe and executive function deficit  Severe episode of recurrent major depressive disorder, without psychotic features (HCC)    Problem List There are no problems to display for this patient.   06/30/2020  Erika [place], MOT, OTR/L  06/30/2020, 12:22 PM  [facility] HOSPITALIZATION PROGRAM [address] [place], [place], [address] Phone: [phone]   Fax:  [phone]  Name: Erika [place] MRN: [id number] Date of Birth: January 12, 1996

## 2020-07-01 NOTE — Psych (Signed)
Virtual Visit via Video Note  I connected with Erika Durham on 06/22/20 at  9:00 AM EST by a video enabled telemedicine application and verified that I am speaking with the correct person using two identifiers.  Location: Patient: patient home Provider: clinical home office   I discussed the limitations of evaluation and management by telemedicine and the availability of in person appointments. The patient expressed understanding and agreed to proceed.  I discussed the assessment and treatment plan with the patient. The patient was provided an opportunity to ask questions and all were answered. The patient agreed with the plan and demonstrated an understanding of the instructions.   The patient was advised to call back or seek an in-person evaluation if the symptoms worsen or if the condition fails to improve as anticipated.  Cln and pt completed treatment plan and pt reports verbal alignment with plan. Pt states verbal consent to treatment and agreement with the group commitments.  I provided 10 minutes of non-face-to-face time during this encounter.   Donia Guiles, LCSW

## 2020-07-01 NOTE — Psych (Signed)
Virtual Visit via Video Note  I connected with Erika Durham on 06/22/20 at  9:00 AM EST by a video enabled telemedicine application and verified that I am speaking with the correct person using two identifiers.  Location: Patient: patient home Provider: Clinical home office   I discussed the limitations of evaluation and management by telemedicine and the availability of in person appointments. The patient expressed understanding and agreed to proceed.  I discussed the assessment and treatment plan with the patient. The patient was provided an opportunity to ask questions and all were answered. The patient agreed with the plan and demonstrated an understanding of the instructions.   The patient was advised to call back or seek an in-person evaluation if the symptoms worsen or if the condition fails to improve as anticipated.  Pt was provided 240 minutes of non-face-to-face time during this encounter.   Donia Guiles, LCSW    [facility] PHP THERAPIST PROGRESS NOTE  [name] [id number]  Session Time: 9:00 - 10:00  Participation Level: Active  Behavioral Response: CasualAlertDepressed  Type of Therapy: Group Therapy  Treatment Goals addressed: Coping  Interventions: CBT, DBT, Supportive and Reframing  Summary: Clinician led check-in regarding current stressors and situation.Clinician utilized active listening and empathetic response and validated patient emotions. Clinician facilitated processing group on pertinent issues.   Therapist Response:  Erika Durham is a 25 y.o. female who presents with depression symptoms.  Patient arrived within time allowed and reports that she is feeling "pretty low." Patient rates hermood at a3on a scale of 1-10 with 10 being great. Pt reportsshe is struggling with brain fog and was able to do a few chores however had to rest afterwards due to fatigue. Pt reports experiencing self harm thoughts and managing by going to sleep. Pt  able to process. Pt engaged in discussion.     Session Time: 10:00 - 11:00   Participation Level:Active  Behavioral Response:CasualAlertDepressed  Type of Therapy:GroupTherapy  Treatment Goals addressed: Coping  Interventions:CBT, DBT, Supportive and Reframing  Summary:Cln led discussion on guilt and the way it impacts [place]. Cln utilized CBT cignitive distortion: emotional reasoning to inform discussion. Cln encouraged pt's to consider whether the guilt was founded as a first step to address the feeling. Group members discussed feelings of guilt and worked to determine whether those feelings were founded in truth or feeling.   Therapist Response:  Pt engaged in discussion and is able to offer a guilt example and work through it with the group.      Session Time: 11:00- 12:00  Participation Level:Active  Behavioral Response:CasualAlertDepressed  Type of Therapy: Group Therapy, OT  Treatment Goals addressed: Coping  Interventions:Psychosocial skills training, Supportive  Summary:Occupational Therapy group  Therapist Response:Patient engaged in group. See OT note.      Session Time: 12:00 -1:00  Participation Level:Active  Behavioral Response:CasualAlertDepressed  Type of Therapy:Group therapy  Treatment Goals addressed: Coping  Interventions:CBT; Solution focused; Supportive; Reframing  Summary:12:00 - 12:50: Cln continued discussion on topic of boundaries. Group reviewed previous aspects of boundaries discussed. Cln utilized handout "Tips for [employer]" and group members discussed how to apply the tips. Cln shaped conversation and cued for healthy boundary characteristics.  12:50 -1:00 Clinician led check-out. Clinician assessed for immediate needs, medication compliance and efficacy, and safety concerns  Therapist Response:12:00 - 12:50:  Pt engaged in discussion and is able to process ways to increase their  healthy boundaries.  12:50 - 1:00: At check-out, patientrates hermood at a3on a scale of  1-10 with 10 being great.Pt reports afternoon plans oftaking a nap. Pt demonstrates some progress as evidenced by participating in first group session. Patient denies SI/HI at the end of group.    Suicidal/Homicidal: Nowithout intent/plan  Plan: Pt will continue in PHP while working to decrease depression symptoms, decrease SI, and increase ability to manage symptoms in a healthy manner.   Diagnosis: Severe episode of recurrent major depressive disorder, without psychotic features (HCC) [F33.2]    1. Severe episode of recurrent major depressive disorder, without psychotic features (HCC)       Donia Guiles, LCSW 07/01/2020

## 2020-07-03 ENCOUNTER — Other Ambulatory Visit: Payer: Self-pay

## 2020-07-03 ENCOUNTER — Other Ambulatory Visit (HOSPITAL_COMMUNITY): Payer: 59 | Admitting: Occupational Therapy

## 2020-07-03 ENCOUNTER — Other Ambulatory Visit (HOSPITAL_COMMUNITY): Payer: 59 | Admitting: Licensed Clinical Social Worker

## 2020-07-03 ENCOUNTER — Encounter (HOSPITAL_COMMUNITY): Payer: Self-pay

## 2020-07-03 DIAGNOSIS — F332 Major depressive disorder, recurrent severe without psychotic features: Secondary | ICD-10-CM

## 2020-07-03 DIAGNOSIS — R4589 Other symptoms and signs involving emotional state: Secondary | ICD-10-CM

## 2020-07-03 DIAGNOSIS — R41844 Frontal lobe and executive function deficit: Secondary | ICD-10-CM

## 2020-07-03 NOTE — Therapy (Signed)
[facility] PARTIAL HOSPITALIZATION PROGRAM [address] [place], [place], [address] Phone: [phone]   Fax:  [phone] Virtual Visit via Video Note  I connected with Erika Durham on 07/03/20 at  11:00 AM EST by a video enabled telemedicine application and verified that I am speaking with the correct person using two identifiers.  Location: Patient: Patient Home Provider: Clinic Office    I discussed the limitations of evaluation and management by telemedicine and the availability of in person appointments. The patient expressed understanding and agreed to proceed.  I discussed the assessment and treatment plan with the patient. The patient was provided an opportunity to ask questions and all were answered. The patient agreed with the plan and demonstrated an understanding of the instructions.   The patient was advised to call back or seek an in-person evaluation if the symptoms worsen or if the condition fails to improve as anticipated.  I provided 60 minutes of non-face-to-face time during this encounter.   Donne Hazel, OT   Occupational Therapy Treatment  Patient Details  Name: Erika Durham MRN: [id number] Date of Birth: 1995/05/15 Referring Provider (OT): Hillery Jacks   Encounter Date: 07/03/2020   OT End of Session - 07/03/20 1256    Visit Number 6    Number of Visits 20    Date for OT Re-Evaluation 07/20/20    Authorization Type [employer]    OT Start Time 1100    OT Stop Time 1200    OT Time Calculation (min) 60 min    Activity Tolerance Patient tolerated treatment well    Behavior During Therapy [facility] for tasks assessed/performed           Past Medical History:  Diagnosis Date  . Anxiety   . Depression   . Migraine   . Panic attack     Past Surgical History:  Procedure Laterality Date  . WISDOM TOOTH EXTRACTION      There were no vitals filed for this visit.   Subjective Assessment - 07/03/20 1255    Currently  in Pain? No/denies                                OT Education - 07/03/20 1255    Education Details Educated on different factors that contribute to our ability to manage our time, along with specific time management tips/strategies    Person(s) Educated Patient    Methods Explanation;Handout    Comprehension Verbalized understanding            OT Short Term Goals - 06/23/20 1400      OT SHORT TERM GOAL #1   Status On-going      OT SHORT TERM GOAL #2   Status On-going      OT SHORT TERM GOAL #3   Status On-going      OT SHORT TERM GOAL #4   Status On-going         Group Session:  S: None noted*  O: Group began with a warm-up ice breaker activity where patients were encouraged to reflect on the scenario "If you had 86,400 dollars dropped into your bank account at midnight and 24 hours to spend it, what you use the money for? The only rule was that any money not used disappeared after 24 hours." Warm-up activity was used as an Web designer and a way to connect that similar to the scenario of money, we  do not get time back and there are 86,400 seconds in a day. Warm-up transitioned into today's group focused on topic of Time Management. Group members identified ways in which they currently struggle with managing their time and discussion focused on alternative strategies to recognize how we can be more productive and intentional with our time and managing it appropriately.   A: Erika Durham was present in group session for duration, camera on, however declined to contribute to verbal discussion. Appeared receptive to education and information received, however was largely an observer and again, did not contribute to discussion or offer feedback to peers. Will continue to encourage active participation and engagement in future sessions.   P: Continue to attend PHP OT group sessions 5x week for 3 weeks to promote daily structure, social engagement, and opportunities  to develop and utilize adaptive strategies to maximize functional performance in preparation for safe transition and integration back into school, work, and the community.    Plan - 07/03/20 1256    Occupational performance deficits (Please refer to evaluation for details): ADL's;IADL's;Rest and Sleep;Education;Work;Leisure;Social Participation    Body Structure / Function / Physical Skills ADL    Cognitive Skills Attention;Emotional;Energy/Drive;Learn;Memory;Perception;Problem Solve;Safety Awareness;Temperament/Personality;Thought;Understand    Psychosocial Skills Coping Strategies;Environmental  Adaptations;Habits;Interpersonal Interaction;Routines and Behaviors           Patient will benefit from skilled therapeutic intervention in order to improve the following deficits and impairments:   Body Structure / Function / Physical Skills: ADL Cognitive Skills: Attention,Emotional,Energy/Drive,Learn,Memory,Perception,Problem Solve,Safety Awareness,Temperament/Personality,Thought,Understand Psychosocial Skills: Coping Strategies,Environmental  Adaptations,Habits,Interpersonal Interaction,Routines and Behaviors   Visit Diagnosis: Difficulty coping  Frontal lobe and executive function deficit  Severe episode of recurrent major depressive disorder, without psychotic features (HCC)    Problem List There are no problems to display for this patient.   07/03/2020  Donne Hazel, MOT, OTR/L  07/03/2020, 12:57 PM  [facility] HOSPITALIZATION PROGRAM [address] [place], [place], [address] Phone: [phone]   Fax:  [phone]  Name: Erika Durham MRN: [id number] Date of Birth: October 31, 1995

## 2020-07-04 ENCOUNTER — Other Ambulatory Visit (HOSPITAL_COMMUNITY): Payer: 59 | Admitting: Occupational Therapy

## 2020-07-04 ENCOUNTER — Other Ambulatory Visit (HOSPITAL_COMMUNITY): Payer: 59 | Attending: Psychiatry | Admitting: Licensed Clinical Social Worker

## 2020-07-04 ENCOUNTER — Other Ambulatory Visit: Payer: Self-pay

## 2020-07-04 ENCOUNTER — Encounter (HOSPITAL_COMMUNITY): Payer: Self-pay

## 2020-07-04 DIAGNOSIS — Z888 Allergy status to other drugs, medicaments and biological substances status: Secondary | ICD-10-CM | POA: Insufficient documentation

## 2020-07-04 DIAGNOSIS — Z91410 Personal history of adult physical and sexual abuse: Secondary | ICD-10-CM | POA: Diagnosis not present

## 2020-07-04 DIAGNOSIS — R4589 Other symptoms and signs involving emotional state: Secondary | ICD-10-CM

## 2020-07-04 DIAGNOSIS — F332 Major depressive disorder, recurrent severe without psychotic features: Secondary | ICD-10-CM | POA: Insufficient documentation

## 2020-07-04 DIAGNOSIS — Z559 Problems related to education and literacy, unspecified: Secondary | ICD-10-CM | POA: Diagnosis not present

## 2020-07-04 DIAGNOSIS — R41844 Frontal lobe and executive function deficit: Secondary | ICD-10-CM

## 2020-07-04 DIAGNOSIS — R45851 Suicidal ideations: Secondary | ICD-10-CM | POA: Diagnosis not present

## 2020-07-04 DIAGNOSIS — Z9151 Personal history of suicidal behavior: Secondary | ICD-10-CM | POA: Insufficient documentation

## 2020-07-04 NOTE — Psych (Signed)
Virtual Visit via Video Note  I connected with Erika Durham on 06/23/20 at  9:00 AM EST by a video enabled telemedicine application and verified that I am speaking with the correct person using two identifiers.  Location: Patient: patient home Provider: Clinical home office   I discussed the limitations of evaluation and management by telemedicine and the availability of in person appointments. The patient expressed understanding and agreed to proceed.  I discussed the assessment and treatment plan with the patient. The patient was provided an opportunity to ask questions and all were answered. The patient agreed with the plan and demonstrated an understanding of the instructions.   The patient was advised to call back or seek an in-person evaluation if the symptoms worsen or if the condition fails to improve as anticipated.  Pt was provided 240 minutes of non-face-to-face time during this encounter.   Donia Guiles, LCSW    [facility] PHP THERAPIST PROGRESS NOTE  [name] [phone]  Session Time: 9:00 - 10:00  Participation Level: Active  Behavioral Response: CasualAlertDepressed  Type of Therapy: Group Therapy  Treatment Goals addressed: Coping  Interventions: CBT, DBT, Supportive and Reframing  Summary: Clinician led check-in regarding current stressors and situation.Clinician utilized active listening and empathetic response and validated patient emotions. Clinician facilitated processing group on pertinent issues.   Therapist Response:  Erika Durham is a 25 y.o. female who presents with depression symptoms.  Patient arrived within time allowed and reports that she is feeling "tired." Patient rates hermood at a3on a scale of 1-10 with 10 being great. Pt reportsshe slept badly due to having a fight with her partner. Pt states she feels like is shutting down and it is interfering with her relationships. Pt able to process. Pt engaged in discussion.      Session Time: 10:00 - 11:00   Participation Level:Active  Behavioral Response:CasualAlertDepressed  Type of Therapy:GroupTherapy  Treatment Goals addressed: Coping  Interventions:CBT, DBT, Supportive and Reframing  Summary:Cln led discussion on rest. Cln discussed the need to rewrite social story of rest being earned or last on the list and assert that rest is productive. Group members discussed barriers to allowing themselves rest and the negative self-talk involved.  Cln worked with group to thought challenge and apply self-coaching strategies.  Therapist Response: Pt engaged in discussion and reports struggle with allowing themselves rest.      Session Time: 11:00- 12:00  Participation Level:Active  Behavioral Response:CasualAlertDepressed  Type of Therapy: Group Therapy, OT  Treatment Goals addressed: Coping  Interventions:Psychosocial skills training, Supportive  Summary:Occupational Therapy group  Therapist Response:Patient engaged in group. See OT note.      Session Time: 12:00 -1:00  Participation Level:Active  Behavioral Response:CasualAlertDepressed  Type of Therapy:Group therapy  Treatment Goals addressed: Coping  Interventions:CBT; Solution focused; Supportive; Reframing  Summary:12:00 - 12:50: Cln continued discussion on rest and introduced the nine types of rest: time away, permission to not be helpful, something unproductive, connection to art and nature, solitude to recharge, break from responsibility, stillness to decompress, safe spce, alone time at home. Group shared ways in which they can utilize each type of rest and which ones are most problematic for them.  12:50 -1:00 Clinician led check-out. Clinician assessed for immediate needs, medication compliance and efficacy, and safety concerns  Therapist Response:12:00 - 12:50:  Pt engaged in discussion and shares "safe space" is most  problematic for her. 12:50 - 1:00: At check-out, patientrates hermood at Erika Durham a scale of 1-10 with 10 being great.Pt  reports afternoon plans oftaking a nap and eating tacos. Pt demonstrates some progress as evidenced by increased sharing in group. Patient denies SI/HI at the end of group.    Suicidal/Homicidal: Nowithout intent/plan  Plan: Pt will continue in PHP while working to decrease depression symptoms, decrease SI, and increase ability to manage symptoms in a healthy manner.   Diagnosis: Severe episode of recurrent major depressive disorder, without psychotic features (HCC) [F33.2]    1. Severe episode of recurrent major depressive disorder, without psychotic features (HCC)       Donia Guiles, LCSW 07/04/2020

## 2020-07-04 NOTE — Progress Notes (Signed)
Spoke with patient via Webex video call, used 2 identifiers to correctly identify patient. She still finds it hard to connect in groups. She is planning to attend a residential program at the end of the month. Her family is supportive. She is switching her Effexor and going to Lexapro over the next week. Denies SI/HI or AV hallucinations. On scale 1-10 as 10 being worst she rates depression at 7 and anxiety at 7. No other issues or complaints.

## 2020-07-04 NOTE — Therapy (Signed)
[facility] PARTIAL HOSPITALIZATION PROGRAM [address] [place], [place], [address] Phone: [phone]   Fax:  [phone] Virtual Visit via Video Note  I connected with Erika Durham on 07/04/20 at  11:00 AM EST by a video enabled telemedicine application and verified that I am speaking with the correct person using two identifiers.  Location: Patient: Patient Home Provider: Clinic Office   I discussed the limitations of evaluation and management by telemedicine and the availability of in person appointments. The patient expressed understanding and agreed to proceed.   I discussed the assessment and treatment plan with the patient. The patient was provided an opportunity to ask questions and all were answered. The patient agreed with the plan and demonstrated an understanding of the instructions.   The patient was advised to call back or seek an in-person evaluation if the symptoms worsen or if the condition fails to improve as anticipated.  I provided 60 minutes of non-face-to-face time during this encounter.   Donne Hazel, OT   Occupational Therapy Treatment  Patient Details  Name: Erika Durham MRN: [id number] Date of Birth: [birth year] Referring Provider (OT): Hillery Jacks   Encounter Date: 07/04/2020   OT End of Session - 07/04/20 1429    Visit Number 7    Number of Visits 20    Date for OT Re-Evaluation 07/20/20    Authorization Type [employer]    OT Start Time 1100    OT Stop Time 1200    OT Time Calculation (min) 60 min    Activity Tolerance Patient tolerated treatment well    Behavior During Therapy [facility] for tasks assessed/performed           Past Medical History:  Diagnosis Date  . Anxiety   . Depression   . Migraine   . Panic attack     Past Surgical History:  Procedure Laterality Date  . WISDOM TOOTH EXTRACTION      There were no vitals filed for this visit.   Subjective Assessment - 07/04/20 1429    Currently  in Pain? No/denies             OT Education - 07/04/20 1429    Education Details Continued education on different factors that contribute to our ability to manage our time, along with specific time management tips/strategies, including procrastination    Person(s) Educated Patient    Methods Explanation;Handout    Comprehension Verbalized understanding            OT Short Term Goals - 06/23/20 1400      OT SHORT TERM GOAL #1   Status On-going      OT SHORT TERM GOAL #2   Status On-going      OT SHORT TERM GOAL #3   Status On-going      OT SHORT TERM GOAL #4   Status On-going          Group Session:  S: None noted*  O: Group began with a recap on strategies/tips learned from previous OT session focused on time management. Today's group continued to focus on topic of time management and reviewed additional strategies to manage and create schedules that are more efficient and effective in meeting their needs. Group members also discussed specific strategies to overcome procrastination.   A: Dominik continued to engage in today's session as mostly an observer, though was present with camera on. Pt did not contribute to group discussion, though appeared receptive and open to receiving education and  strategies to combat procrastination and improve time management.   P: Continue to attend PHP OT group sessions 5x week for 2 weeks to promote daily structure, social engagement, and opportunities to develop and utilize adaptive strategies to maximize functional performance in preparation for safe transition and integration back into school, work, and the community. Plan to address topic of goal-setting in next OT group session.   Plan - 07/04/20 1429    Occupational performance deficits (Please refer to evaluation for details): ADL's;IADL's;Rest and Sleep;Education;Work;Leisure;Social Participation    Body Structure / Function / Physical Skills ADL    Cognitive Skills  Attention;Emotional;Energy/Drive;Learn;Memory;Perception;Problem Solve;Safety Awareness;Temperament/Personality;Thought;Understand    Psychosocial Skills Coping Strategies;Environmental  Adaptations;Habits;Interpersonal Interaction;Routines and Behaviors           Patient will benefit from skilled therapeutic intervention in order to improve the following deficits and impairments:   Body Structure / Function / Physical Skills: ADL Cognitive Skills: Attention,Emotional,Energy/Drive,Learn,Memory,Perception,Problem Solve,Safety Awareness,Temperament/Personality,Thought,Understand Psychosocial Skills: Coping Strategies,Environmental  Adaptations,Habits,Interpersonal Interaction,Routines and Behaviors   Visit Diagnosis: Difficulty coping  Frontal lobe and executive function deficit  Severe episode of recurrent major depressive disorder, without psychotic features (HCC)    Problem List There are no problems to display for this patient.   07/04/2020  Donne Hazel, MOT, OTR/L  07/04/2020, 2:30 PM  [facility] HOSPITALIZATION PROGRAM [address] [place], [place], [address] Phone: [phone]   Fax:  [phone]  Name: Erika Durham MRN: [id number] Date of Birth: Dec 03, 1995

## 2020-07-05 ENCOUNTER — Other Ambulatory Visit: Payer: Self-pay

## 2020-07-05 ENCOUNTER — Encounter (HOSPITAL_COMMUNITY): Payer: Self-pay

## 2020-07-05 ENCOUNTER — Other Ambulatory Visit (HOSPITAL_COMMUNITY): Payer: 59 | Admitting: Licensed Clinical Social Worker

## 2020-07-05 ENCOUNTER — Other Ambulatory Visit (HOSPITAL_COMMUNITY): Payer: 59 | Admitting: Occupational Therapy

## 2020-07-05 DIAGNOSIS — F332 Major depressive disorder, recurrent severe without psychotic features: Secondary | ICD-10-CM

## 2020-07-05 DIAGNOSIS — R4589 Other symptoms and signs involving emotional state: Secondary | ICD-10-CM

## 2020-07-05 DIAGNOSIS — R45851 Suicidal ideations: Secondary | ICD-10-CM | POA: Diagnosis not present

## 2020-07-05 DIAGNOSIS — R41844 Frontal lobe and executive function deficit: Secondary | ICD-10-CM

## 2020-07-05 NOTE — Therapy (Signed)
[facility] PARTIAL HOSPITALIZATION PROGRAM [address] [place], [place], [address] Phone: [phone]   Fax:  [phone] Virtual Visit via Video Note  I connected with Erika Durham on 07/05/20 at  12:00 PM EST by a video enabled telemedicine application and verified that I am speaking with the correct person using two identifiers.  Location: Patient: Patient Home Provider: Clinic Office   I discussed the limitations of evaluation and management by telemedicine and the availability of in person appointments. The patient expressed understanding and agreed to proceed.   I discussed the assessment and treatment plan with the patient. The patient was provided an opportunity to ask questions and all were answered. The patient agreed with the plan and demonstrated an understanding of the instructions.   The patient was advised to call back or seek an in-person evaluation if the symptoms worsen or if the condition fails to improve as anticipated.  I provided 55 minutes of non-face-to-face time during this encounter.   Erika Durham, OT   Occupational Therapy Treatment  Patient Details  Name: Erika Durham MRN: [id number] Date of Birth: 1995-05-29 Referring Provider (OT): Hillery Jacks   Encounter Date: 07/05/2020   OT End of Session - 07/05/20 1448    Visit Number 8    Number of Visits 20    Date for OT Re-Evaluation 07/20/20    Authorization Type [employer]    OT Start Time 1200    OT Stop Time 1255    OT Time Calculation (min) 55 min    Activity Tolerance Patient tolerated treatment well    Behavior During Therapy [facility] for tasks assessed/performed           Past Medical History:  Diagnosis Date  . Anxiety   . Depression   . Migraine   . Panic attack     Past Surgical History:  Procedure Laterality Date  . WISDOM TOOTH EXTRACTION      There were no vitals filed for this visit.   Subjective Assessment - 07/05/20 1447    Currently  in Pain? No/denies           OT Education - 07/05/20 1447    Education Details Educated on strategies/tips to overcome procrastination and reviewed goal-setting using the SMART goal framework    Person(s) Educated Patient    Methods Explanation;Handout    Comprehension Verbalized understanding            OT Short Term Goals - 06/23/20 1400      OT SHORT TERM GOAL #1   Status On-going      OT SHORT TERM GOAL #2   Status On-going      OT SHORT TERM GOAL #3   Status On-going      OT SHORT TERM GOAL #4   Status On-going         Group Session:  S: "I will knit a dishcloth by the end of the week."  O: O: Today's group session encouraged engagement and participation through discussion focused on procrastination and goal-setting. Group members were introduced to goal-setting using the SMART goal framework, identifying goals as Specific, Measurable, Achievable, Relevant, and Time-Bound. Group members took time from group to create their own personal goal reflecting the SMART goal template and shared for review by peers and OT.     A: Erika Durham was present in group setting for duration, however required max verbal cues to engage in discussion and activity. Pt created a goal to fit the SMART  framework and shared with the group "I will knit a dishcloth by the end of the week" and shared that knitting is one of her coping strategies and she has made sweaters and other blankets in the past. Appeared receptive to education and additional support around goal-setting.   P: Continue to attend PHP OT group sessions 5x week for 2 weeks to promote daily structure, social engagement, and opportunities to develop and utilize adaptive strategies to maximize functional performance in preparation for safe transition and integration back into school, work, and the community. Plan to address topic of self-esteem in next OT group session.   Plan - 07/05/20 1448    Occupational performance deficits  (Please refer to evaluation for details): ADL's;IADL's;Rest and Sleep;Education;Work;Leisure;Social Participation    Body Structure / Function / Physical Skills ADL    Cognitive Skills Attention;Emotional;Energy/Drive;Learn;Memory;Perception;Problem Solve;Safety Awareness;Temperament/Personality;Thought;Understand    Psychosocial Skills Coping Strategies;Environmental  Adaptations;Habits;Interpersonal Interaction;Routines and Behaviors           Patient will benefit from skilled therapeutic intervention in order to improve the following deficits and impairments:   Body Structure / Function / Physical Skills: ADL Cognitive Skills: Attention,Emotional,Energy/Drive,Learn,Memory,Perception,Problem Solve,Safety Awareness,Temperament/Personality,Thought,Understand Psychosocial Skills: Coping Strategies,Environmental  Adaptations,Habits,Interpersonal Interaction,Routines and Behaviors   Visit Diagnosis: Difficulty coping  Frontal lobe and executive function deficit  Severe episode of recurrent major depressive disorder, without psychotic features (HCC)    Problem List There are no problems to display for this patient.   07/05/2020  Erika Durham, MOT, OTR/L  07/05/2020, 2:49 PM  [facility] HOSPITALIZATION PROGRAM [address] [place], [place], [address] Phone: [phone]   Fax:  [phone]  Name: Erika Durham MRN: [id number] Date of Birth: 13-Jun-1995

## 2020-07-05 NOTE — Psych (Signed)
Virtual Visit via Video Note  I connected with Erika Durham on 06/23/20 at  9:00 AM EST by a video enabled telemedicine application and verified that I am speaking with the correct person using two identifiers.  Location: Patient: patient home Provider: Clinical home office   I discussed the limitations of evaluation and management by telemedicine and the availability of in person appointments. The patient expressed understanding and agreed to proceed.  I discussed the assessment and treatment plan with the patient. The patient was provided an opportunity to ask questions and all were answered. The patient agreed with the plan and demonstrated an understanding of the instructions.   The patient was advised to call back or seek an in-person evaluation if the symptoms worsen or if the condition fails to improve as anticipated.  Pt was provided 240 minutes of non-face-to-face time during this encounter.   [name]    [facility] PHP THERAPIST PROGRESS NOTE  Erika Durham [id number]  Session Time: 9:00 - 10:00  Participation Level: Active  Behavioral Response: CasualAlertDepressed  Type of Therapy: Group Therapy  Treatment Goals addressed: Coping  Interventions: CBT, DBT, Supportive and Reframing  Summary: Clinician led check-in regarding current stressors and situation.Clinician utilized active listening and empathetic response and validated patient emotions. Clinician facilitated processing group on pertinent issues.   Therapist Response:  Erika Durham is a 25 y.o. female who presents with depression symptoms.  Patient arrived within time allowed and reports that she is feeling "tired." Patient rates hermood at a2on a scale of 1-10 with 10 being great. Patient reports she continues to struggle with depression and sadness, however she denied having any SI, HI, AVH.     Session Time: 10:00 - 11:00 Emotional Regulation and Distress Tolerance    Participation Level:Active  Behavioral Response:CasualAlertDepressed  Type of Therapy:GroupTherapy  Treatment Goals addressed: Coping  Interventions:CBT, DBT, Supportive and Reframing  Summary:  Therapist Response:     Session Time: 11:00- 12:00 Chaplain Group   Participation Level:Active  Behavioral Response:CasualAlertDepressed  Type of Therapy: Group Therapy, OT  Treatment Goals addressed: Coping  Interventions:Psychosocial skills training, Supportive  Summary:Occupational Therapy group  Therapist Response:Patient engaged in group. See Chaplain's note.      Session Time: 12:00 -1:00 Occupational Therapy   Participation Level:Active  Behavioral Response:CasualAlertDepressed  Type of Therapy:Group therapy  Treatment Goals addressed: Coping  Interventions:CBT; Solution focused; Supportive; Reframing  Summary:12:00 - 12:50: Cln continued discussion on rest and introduced the nine types of rest: time away, permission to not be helpful, something unproductive, connection to art and nature, solitude to recharge, break from responsibility, stillness to decompress, safe spce, alone time at home. Group shared ways in which they can utilize each type of rest and which ones are most problematic for them.  12:50 -1:00 Clinician led check-out. Clinician assessed for immediate needs, medication compliance and efficacy, and safety concerns  Therapist Response:12:00 - 12:50:  Patient was engaged during discussion. Please see OT note.    12:50 - 1:00: At check-out, patientrates hermood at [facility] a scale of 1-10 with 10 being great.Patient reports she has no plans this afternoon other than sleeping. She reports she was very tired.  Pt demonstrates some progress as evidenced by increased sharing in group. Patient denies SI/HI at the end of group.    Suicidal/Homicidal: Nowithout intent/plan  Plan: Pt will continue in PHP while  working to decrease depression symptoms, decrease SI, and increase ability to manage symptoms in a healthy manner.   Diagnosis:  No primary diagnosis found.    No diagnosis found.    [facility] 07/05/2020

## 2020-07-06 ENCOUNTER — Other Ambulatory Visit (HOSPITAL_COMMUNITY): Payer: 59

## 2020-07-06 ENCOUNTER — Other Ambulatory Visit: Payer: Self-pay

## 2020-07-06 ENCOUNTER — Other Ambulatory Visit (HOSPITAL_COMMUNITY): Payer: 59 | Admitting: Licensed Clinical Social Worker

## 2020-07-06 DIAGNOSIS — F332 Major depressive disorder, recurrent severe without psychotic features: Secondary | ICD-10-CM

## 2020-07-06 NOTE — Progress Notes (Signed)
Patient did not present to group therapy today. CSW will attempt to contact patient to determine reasoning.    CSW will continue to follow.    Baldo Daub, MSW, LCSW Clinical Child psychotherapist (Facility Based Crisis) [facility]

## 2020-07-07 ENCOUNTER — Other Ambulatory Visit: Payer: Self-pay

## 2020-07-07 ENCOUNTER — Other Ambulatory Visit (HOSPITAL_COMMUNITY): Payer: 59 | Admitting: Family

## 2020-07-07 ENCOUNTER — Other Ambulatory Visit (HOSPITAL_COMMUNITY): Payer: 59 | Admitting: Occupational Therapy

## 2020-07-07 ENCOUNTER — Encounter (HOSPITAL_COMMUNITY): Payer: Self-pay

## 2020-07-07 DIAGNOSIS — R4589 Other symptoms and signs involving emotional state: Secondary | ICD-10-CM

## 2020-07-07 DIAGNOSIS — F332 Major depressive disorder, recurrent severe without psychotic features: Secondary | ICD-10-CM

## 2020-07-07 DIAGNOSIS — R45851 Suicidal ideations: Secondary | ICD-10-CM | POA: Diagnosis not present

## 2020-07-07 DIAGNOSIS — R41844 Frontal lobe and executive function deficit: Secondary | ICD-10-CM

## 2020-07-07 NOTE — Progress Notes (Signed)
Virtual Visit via Video Note  I connected with Erika Durham on 07/07/20 at  9:00 AM EST by a video enabled telemedicine application and verified that I am speaking with the correct person using two identifiers.  Location: Patient: home Provider: Office    I discussed the limitations of evaluation and management by telemedicine and the availability of in person appointments. The patient expressed understanding and agreed to proceed.   I discussed the assessment and treatment plan with the patient. The patient was provided an opportunity to ask questions and all were answered. The patient agreed with the plan and demonstrated an understanding of the instructions.   The patient was advised to call back or seek an in-person evaluation if the symptoms worsen or if the condition fails to improve as anticipated.  I provided 15 minutes of non-face-to-face time during this encounter.   Erika Rack, NP   [facility] MD/PA/NP OP Progress Note  07/07/2020 9:31 AM Erika Durham  MRN:  [id number]  Subjective: Erika Durham stated " I have no sex drive on this medication."   Evaluation: Erika Durham was seen and evaluated via WebEx.  She presents with a flat and guarded affect.  Denying suicidal or homicidal ideations.  Denies auditory or visual hallucinations.  Patient reports she was recently started on Effexor 37.5mg .  NP increased Effexor to 75 mg daily.  Reports " do not feel as if Effexor is helping.  Discussed titrating Effexor  to 150 mg.  Reports she feels as if Effexor is decreasing hersex drive.  reports multiple failed SSRIs and SNRI.  States " low energy".  Discussed consider patient for TMS.  As she reports failed medication with Wellbutrin, Prozac, Viibryd, Pristiq and Celexa.  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  Discussed initiating Lexapro. Patient was receptive to plan.  Support, encouragement and  reassurance was provided.   Visit Diagnosis: No diagnosis found.  Past Psychiatric History:    Past Medical History:  Past Medical History:  Diagnosis Date  . Anxiety   . Depression   . Migraine   . Panic attack     Past Surgical History:  Procedure Laterality Date  . WISDOM TOOTH EXTRACTION      Family Psychiatric History: Family History:  Family History  Problem Relation Age of Onset  . Healthy Mother   . Anxiety disorder Mother   . Healthy Father   . Depression Sister   . Anxiety disorder Sister     Social History:  Social History   Socioeconomic History  . Marital status: Single    Spouse name: Not on file  . Number of children: 0  . Years of education: Not on file  . Highest education level: Some college, no degree  Occupational History  . Not on file  Tobacco Use  . Smoking status: Never Smoker  . Smokeless tobacco: Never Used  Vaping Use  . Vaping Use: Never used  Substance and Sexual Activity  . Alcohol use: Yes    Comment: socially  . Drug use: Yes    Types: Marijuana    Comment: socially  . Sexual activity: Not on file  Other Topics Concern  . Not on file  Social History Narrative  . Not on file   Social Determinants of Health   Financial Resource Strain: Not on file  Food Insecurity: Not on file  Transportation Needs: Not on file  Physical Activity: Not on file  Stress: Not on file  Social Connections: Not on file    Allergies:  Allergies  Allergen Reactions  . Rizatriptan Anxiety    Other reaction(s): Other (See Comments) Body wide migraine.  Right side body pain.     Metabolic Disorder Labs: Lab Results  Component Value Date   HGBA1C 4.6 (L) 06/13/2020   MPG 85.32 06/13/2020   No results found for: PROLACTIN Lab Results  Component Value Date   CHOL 159 06/13/2020   TRIG 53 06/13/2020   HDL 52 06/13/2020   CHOLHDL 3.1 06/13/2020   VLDL 11 06/13/2020   LDLCALC 96 06/13/2020   Lab Results  Component Value Date   TSH 1.499 06/13/2020    Therapeutic Level Labs: No results found for: LITHIUM No results  found for: VALPROATE No components found for:  CBMZ  Current Medications: Current Outpatient Medications  Medication Sig Dispense Refill  . clonazePAM (KLONOPIN) 0.5 MG tablet Take 0.25 mg by mouth daily as needed for anxiety.    . traZODone (DESYREL) 50 MG tablet Take 1 tablet (50 mg total) by mouth at bedtime as needed for sleep. 30 tablet 0  . venlafaxine XR (EFFEXOR XR) 75 MG 24 hr capsule Take 1 capsule (75 mg total) by mouth daily. 30 capsule 2  . venlafaxine XR (EFFEXOR-XR) 37.5 MG 24 hr capsule Take 1 capsule (37.5 mg total) by mouth daily. (Patient not taking: Reported on 07/04/2020) 30 capsule 0   No current facility-administered medications for this visit.     Musculoskeletal: Strength & Muscle Tone: within normal limits Gait & Station: normal Patient leans: N/A  Psychiatric Specialty Exam: Review of Systems  There were no vitals taken for this visit.There is no height or weight on file to calculate BMI.  General Appearance: Casual  Eye Contact:  Good  Speech:  Clear and Coherent  Volume:  Normal  Mood:  Anxious and Depressed  Affect:  Depressed and Flat  Thought Process:  Coherent  Orientation:  Full (Time, Place, and Person)  Thought Content: Logical   Suicidal Thoughts:  No  Homicidal Thoughts:  No  Memory:  Immediate;   Fair Recent;   Fair  Judgement:  Fair  Insight:  Fair  Psychomotor Activity:  Normal  Concentration:  Concentration: Fair  Recall:  Fair  Fund of Knowledge: Good  Language: Good  Akathisia:  No  Handed:  Right  AIMS (if indicated):   Assets:  Communication Skills Desire for Improvement Social Support Talents/Skills  ADL's:  Intact  Cognition: WNL  Sleep:  Fair   Screenings: Administrator, sports from 06/23/2020 in [facility] PARTIAL HOSPITALIZATION PROGRAM Counselor from 06/21/2020 in BEHAVIORAL HEALTH PARTIAL HOSPITALIZATION PROGRAM  PHQ-2 Total Score 6 6  PHQ-9 Total Score 23 20    Flowsheet Row Counselor from  06/23/2020 in BEHAVIORAL HEALTH PARTIAL HOSPITALIZATION PROGRAM Counselor from 06/21/2020 in BEHAVIORAL HEALTH PARTIAL HOSPITALIZATION PROGRAM ED from 06/13/2020 in [facility]  C-SSRS RISK CATEGORY Error: Question 6 not populated Error: Q7 should not be populated when Q6 is No Error: Q7 should not be populated when Q6 is No       Assessment and Plan:  Continue partial hospitalization programming Will initiate Lexapro 5 mg p.o. daily Patient to discontinue Effexor  Treatment plan was reviewed and agreed upon by NP T. Melvyn Neth and patient Ranita Stjulien  need for continued group services  Erika Rack, NP 07/07/2020, 9:31 AM

## 2020-07-07 NOTE — Psych (Signed)
Virtual Visit via Video Note  I connected with Erika Durham on 06/23/20 at  9:00 AM EST by a video enabled telemedicine application and verified that I am speaking with the correct person using two identifiers.  Location: Patient: patient home Provider: Clinical home office   I discussed the limitations of evaluation and management by telemedicine and the availability of in person appointments. The patient expressed understanding and agreed to proceed.  I discussed the assessment and treatment plan with the patient. The patient was provided an opportunity to ask questions and all were answered. The patient agreed with the plan and demonstrated an understanding of the instructions.   The patient was advised to call back or seek an in-person evaluation if the symptoms worsen or if the condition fails to improve as anticipated.  Pt was provided 240 minutes of non-face-to-face time during this encounter.   [name]    [facility] Behavioral PHP THERAPIST PROGRESS NOTE  Erika Durham [id number]  Session Time: 9:00 - 10:00  Participation Level: Active  Behavioral Response: CasualAlertDepressed  Type of Therapy: Group Therapy  Treatment Goals addressed: Coping  Interventions: CBT, DBT, Supportive and Reframing  Summary: Clinician led check-in regarding current stressors and situation.Clinician utilized active listening and empathetic response and validated patient emotions. Clinician facilitated processing group on pertinent issues.   Therapist Response:  Erika Durham is a 25 y.o. female who presents with depression symptoms.    Patient arrived within time allowed and reports "I'm okay, I kind of have a headache". I ran some errands yesterday. Erika Durham rated her mood at a 5 today.    Session Time: 10:00 - 11:00 Building Self Esteem   Participation Level:Active  Behavioral Response:CasualAlertDepressed  Type of Therapy:GroupTherapy  Treatment Goals  addressed: Coping  Interventions:CBT, DBT, Supportive and Reframing  Summary: Explore the correlations between having and maintaining a healthy "self" esteem with maintaining healthy relationships utilizing psycho education, cognitive behavioral therapy, client centered and strengths-based therapeutic modalities. Explore self-care tips, including strategic techniques to demonstrate gratitude and self-compassion to oneself.   Therapist Response: Erika Durham reports that self-esteem is how you feel about yourself. She identified the following factors that contribute to your self-esteem: "Relationships are major in self-esteem". The patient discussed and reviewed how morals and values contribute to your self-esteem. They also discussed how relationships effect your self-esteem. Erika Durham reports "If you are in a relationship with someone who has good self-esteem, and you have low self-esteem, it causes you to self-sabotage". The patient completed a strengths and qualities worksheet and contributed their findings to their self-esteem.     Session Time: 11:00- 12:00 Occupational Therapy   Participation Level:Active  Behavioral Response:CasualAlertDepressed  Type of Therapy: Group Therapy, OT  Treatment Goals addressed: Coping  Interventions:Psychosocial skills training, Supportive  Summary:Occupational Therapy group  Therapist Response:Patient engaged in group. See OT note.      Session Time: 12:00 -1:00  Self Care, Self Compassion and Gratitude  Participation Level:Active  Behavioral Response:CasualAlertDepressed  Type of Therapy:Group therapy  Treatment Goals addressed: Coping  Interventions:CBT; Solution focused; Supportive; Reframing  Summary:12:00 - 12:50: Clinician discussed tips and techniques to enhance ability to practice self care, self compassion and gratitude; 12:50 -1:00 Clinician led check-out. Clinician assessed for immediate needs,  medication compliance and efficacy, and safety concerns   Therapist Response:12:00 - 12:50: Erika Durham reports that self-esteem is how you feel about yourself. She identified the following factors that contribute to your self-esteem: "Relationships are major in self-esteem". The patient discussed and reviewed how  morals and values contribute to your self-esteem. They also discussed how relationships effect your self-esteem. Erika Durham reports "If you are in a relationship with someone who has good self-esteem, and you have low self-esteem, it causes you to self-sabotage". The patient completed a strengths and qualities worksheet and contributed their findings to their self-esteem.   12:50 - 1:00: At check-out, patientreports she is feeling pretty good. Rated her mood at a 6. Reports she does not have any plans this weekend. Denies any SI, Hi, AVH   Suicidal/Homicidal: Nowithout intent/plan  Plan: Pt will continue in PHP while working to decrease depression symptoms, decrease SI, and increase ability to manage symptoms in a healthy manner.   Diagnosis: No primary diagnosis found.    No diagnosis found.    Erika Rack, NP 07/07/2020

## 2020-07-07 NOTE — Therapy (Signed)
[facility] PARTIAL HOSPITALIZATION PROGRAM [address] [place], [place], [address] Phone: [phone]   Fax:  [phone] Virtual Visit via Video Note  I connected with Erika Durham on 07/07/20 at  11:00 AM EST by a video enabled telemedicine application and verified that I am speaking with the correct person using two identifiers.  Location: Patient: Patient Home Provider: Clinic Office   I discussed the limitations of evaluation and management by telemedicine and the availability of in person appointments. The patient expressed understanding and agreed to proceed.  I discussed the assessment and treatment plan with the patient. The patient was provided an opportunity to ask questions and all were answered. The patient agreed with the plan and demonstrated an understanding of the instructions.   The patient was advised to call back or seek an in-person evaluation if the symptoms worsen or if the condition fails to improve as anticipated.  I provided 60 minutes of non-face-to-face time during this encounter.   Donne Hazel, OT   Occupational Therapy Treatment  Patient Details  Name: Erika Durham MRN: [id number] Date of Birth: [birth year] Referring Provider (OT): Hillery Jacks   Encounter Date: 07/07/2020   OT End of Session - 07/07/20 1414    Visit Number 9    Number of Visits 20    Date for OT Re-Evaluation 07/20/20    Authorization Type [employer]    OT Start Time 1100    OT Stop Time 1200    OT Time Calculation (min) 60 min    Activity Tolerance Patient tolerated treatment well    Behavior During Therapy [facility] for tasks assessed/performed           Past Medical History:  Diagnosis Date  . Anxiety   . Depression   . Migraine   . Panic attack     Past Surgical History:  Procedure Laterality Date  . WISDOM TOOTH EXTRACTION      There were no vitals filed for this visit.   Subjective Assessment - 07/07/20 1413    Currently in  Pain? No/denies             OT Education - 07/07/20 1413    Education Details Educated on concept of sensory modulation and self-soothing as coping strategies through use of the eight senses    Person(s) Educated Patient    Methods Explanation;Handout    Comprehension Verbalized understanding            OT Short Term Goals - 06/23/20 1400      OT SHORT TERM GOAL #1   Status On-going      OT SHORT TERM GOAL #2   Status On-going      OT SHORT TERM GOAL #3   Status On-going      OT SHORT TERM GOAL #4   Status On-going         Group Session:  S: "listening to video essays, taking a hot shower, baking"  O: Today's group session focused on topic of sensory modulation and self-soothing through use of the 8 senses. Discussion introduced the concept of sensory modulation and integration, focusing on how we can utilize our body and it's senses to self-soothe or cope, when we are experiencing an over or under-whelming sensation or feeling. Group members were introduced to a sensory diet checklist as a helpful tool/resource that can be utilized to identify what activities and strategies we prefer and do not prefer based upon our response to different stimulus. The concept  of alerting vs calming activities was also introduced to understand how to counteract how we are feeling (Example: when we are feeling overwhelmed/stressed, engage in something calming. When we are feeling depressed/low energy, engage in something alerting). Group members engaged actively in discussion sharing their own personal sensory likes/dislikes.    A: Kathrynne was active and independent in her participation of today's discussion and activity, sharing several self-soothing activities and preferences. Pt shared calming activities for her are listening to video essays and hearing other people talk. She shared alerting activities for her are taking a shower and baking cakes/cookies. She appeared receptive to additional  strategies and education offered to expand her self-soothing coping strategies.   P: Continue to attend PHP OT group sessions 5x week for 2 weeks to promote daily structure, social engagement, and opportunities to develop and utilize adaptive strategies to maximize functional performance in preparation for safe transition and integration back into school, work, and the community.     Plan - 07/07/20 1414    Occupational performance deficits (Please refer to evaluation for details): ADL's;IADL's;Rest and Sleep;Education;Work;Leisure;Social Participation    Body Structure / Function / Physical Skills ADL    Cognitive Skills Attention;Emotional;Energy/Drive;Learn;Memory;Perception;Problem Solve;Safety Awareness;Temperament/Personality;Thought;Understand    Psychosocial Skills Coping Strategies;Environmental  Adaptations;Habits;Interpersonal Interaction;Routines and Behaviors           Patient will benefit from skilled therapeutic intervention in order to improve the following deficits and impairments:   Body Structure / Function / Physical Skills: ADL Cognitive Skills: Attention,Emotional,Energy/Drive,Learn,Memory,Perception,Problem Solve,Safety Awareness,Temperament/Personality,Thought,Understand Psychosocial Skills: Coping Strategies,Environmental  Adaptations,Habits,Interpersonal Interaction,Routines and Behaviors   Visit Diagnosis: Difficulty coping  Frontal lobe and executive function deficit  Severe episode of recurrent major depressive disorder, without psychotic features (HCC)    Problem List There are no problems to display for this patient.   07/07/2020  Donne Hazel, MOT, OTR/L  07/07/2020, 2:14 PM  [facility] HOSPITALIZATION PROGRAM [address] [place], [place], [address] Phone: [phone]   Fax:  [phone]  Name: Erika Durham MRN: [id number] Date of Birth: [birth year]

## 2020-07-09 ENCOUNTER — Encounter (HOSPITAL_COMMUNITY): Payer: Self-pay | Admitting: Family

## 2020-07-09 MED ORDER — ESCITALOPRAM OXALATE 5 MG PO TABS: 5.0000 mg | ORAL_TABLET | Freq: Every day | ORAL | 0 refills | Status: DC

## 2020-07-10 ENCOUNTER — Encounter (HOSPITAL_COMMUNITY): Payer: Self-pay

## 2020-07-10 ENCOUNTER — Other Ambulatory Visit (HOSPITAL_COMMUNITY): Payer: 59 | Admitting: Licensed Clinical Social Worker

## 2020-07-10 ENCOUNTER — Other Ambulatory Visit: Payer: Self-pay

## 2020-07-10 ENCOUNTER — Other Ambulatory Visit (HOSPITAL_COMMUNITY): Payer: 59 | Admitting: Occupational Therapy

## 2020-07-10 DIAGNOSIS — F332 Major depressive disorder, recurrent severe without psychotic features: Secondary | ICD-10-CM | POA: Diagnosis not present

## 2020-07-10 DIAGNOSIS — R41844 Frontal lobe and executive function deficit: Secondary | ICD-10-CM

## 2020-07-10 DIAGNOSIS — R45851 Suicidal ideations: Secondary | ICD-10-CM | POA: Diagnosis not present

## 2020-07-10 DIAGNOSIS — R4589 Other symptoms and signs involving emotional state: Secondary | ICD-10-CM

## 2020-07-10 NOTE — Psych (Addendum)
Virtual Visit via Video Note  I connected with Erika Durham on 03/7//22 at  9:00 AM EST by a video enabled telemedicine application and verified that I am speaking with the correct person using two identifiers.  Location: Patient: patient home Provider: Clinical home office   I discussed the limitations of evaluation and management by telemedicine and the availability of in person appointments. The patient expressed understanding and agreed to proceed.  I discussed the assessment and treatment plan with the patient. The patient was provided an opportunity to ask questions and all were answered. The patient agreed with the plan and demonstrated an understanding of the instructions.   The patient was advised to call back or seek an in-person evaluation if the symptoms worsen or if the condition fails to improve as anticipated.  Pt was provided 240 minutes of non-face-to-face time during this encounter.   Erika Durham    [facility] PHP THERAPIST PROGRESS NOTE  Erika Durham [id number]  Session Time: 9:00 - 10:00  Participation Level: Active  Behavioral Response: CasualAlertDepressed  Type of Therapy: Group Therapy  Treatment Goals addressed: Coping  Interventions: CBT, DBT, Supportive and Reframing  Summary: Clinician led check-in regarding current stressors and situation.Clinician utilized active listening and empathetic response and validated patient emotions. Clinician facilitated processing group on pertinent issues.   Therapist Response:  Reports a trigger for her is her mother. She shared that she feels that her mother is toxic. Reports her mother placed her in a psych ward after she came out as a lesbian. Erika Durham states that her mother is verbally abusive, removes access to resources such as Programmer, applications and money. States that her mother has severe trust issues. Reports she becomes severely anxious, and that she shuts down. Patient identified recent  triggers and what behaviors and actions once they felt triggered. The patient also discussed the consequences for their triggers and how that has had an impact in their life currently.    Session Time: 10:00 - 11:00 Discussing Stigmas and Identifying Triggers   Participation Level:Active  Behavioral Response:CasualAlertDepressed  Type of Therapy:GroupTherapy  Treatment Goals addressed: Coping  Interventions:CBT, DBT, Supportive and Reframing  Summary: Allow patients to explore their thoughts and feelings about diagnoses they have received. Patients will be guided to explore their level of understanding and acceptance of these diagnoses. Facilitator will encourage patients to process their thoughts and feelings about the reactions of others to their diagnosis and will guide patients in identifying ways to discuss their diagnosis with significant others in their lives. This group will be process-oriented, with patients participating in exploration of their own experiences, giving, and receiving support, and processing challenge from other group members.  Therapist Response: Reports a trigger for her is her mother. She shared that she feels that her mother is toxic. Reports her mother placed her in a psych ward after she came out as a lesbian. Erika Durham states that her mother is verbally abusive, removes access to resources such as Programmer, applications and money. States that her mother has severe trust issues. Reports she becomes severely anxious, and that she shuts down. Patient identified recent triggers and what behaviors and actions once they felt triggered. The patient also discussed the consequences for their triggers and how that has had an impact in their life currently.      Session Time: 11:00- 12:00 Occupational Therapy   Participation Level:Active  Behavioral Response:CasualAlertDepressed  Type of Therapy: Group Therapy, OT  Treatment Goals addressed:  Coping  Interventions:Psychosocial  skills training, Supportive  Summary:Occupational Therapy group  Therapist Response:Patient engaged in group. See OT note.      Session Time: 12:00 -1:00  Radical Acceptance   Participation Level:Active  Behavioral Response:CasualAlertDepressed  Type of Therapy:Group therapy  Treatment Goals addressed: Coping  Interventions:CBT; Solution focused; Supportive; Reframing  Summary:12:00 - 12:50: Clinician discussed tips and techniques to enhance ability to practice radical acceptance.   12:50 -1:00 Clinician led check-out. Clinician assessed for immediate needs, medication compliance and efficacy, and safety concerns   Therapist Response:12:00 - 12:50: I can't control my mother's actions, and how she talks to me, but I can control how I react to it.  Erika Durham states that she plans to maintain healthy boundaries and continuing to communicate her needs and if mom cannot accept those then there will be more rigid boundaries set in place. Patient reflected on past or current stressful situations that they could radically accept.    12:50 - 1:00: Reports she is doing "okay". Denied having any issues. [name] rated her mood at a 5. Denied SI, HI, AVH.    Suicidal/Homicidal: Nowithout intent/plan  Plan: Pt will continue in PHP while working to decrease depression symptoms, decrease SI, and increase ability to manage symptoms in a healthy manner.   Diagnosis: No primary diagnosis found.    No diagnosis found.    [facility] PHP [facility] 07/10/2020

## 2020-07-10 NOTE — Therapy (Signed)
[facility] PARTIAL HOSPITALIZATION PROGRAM [address] [place], [place], [address] Phone: [phone]   Fax:  [phone] Virtual Visit via Video Note  I connected with Erika Durham on 07/10/20 at 11:00 AM EST by a video enabled telemedicine application and verified that I am speaking with the correct person using two identifiers.  Location: Patient: Patient Home Provider: Clinic Office   I discussed the limitations of evaluation and management by telemedicine and the availability of in person appointments. The patient expressed understanding and agreed to proceed.   I discussed the assessment and treatment plan with the patient. The patient was provided an opportunity to ask questions and all were answered. The patient agreed with the plan and demonstrated an understanding of the instructions.   The patient was advised to call back or seek an in-person evaluation if the symptoms worsen or if the condition fails to improve as anticipated.  I provided 60 minutes of non-face-to-face time during this encounter.   Donne Hazel, OT   Occupational Therapy Treatment  Patient Details  Name: Erika Durham MRN: [id number] Date of Birth: [birth year] Referring Provider (OT): Hillery Jacks   Encounter Date: 07/10/2020   OT End of Session - 07/10/20 1300    Visit Number 10    Number of Visits 20    Date for OT Re-Evaluation 07/20/20    Authorization Type [employer]    OT Start Time 1100    OT Stop Time 1200    OT Time Calculation (min) 60 min    Activity Tolerance Patient tolerated treatment well    Behavior During Therapy [facility] for tasks assessed/performed           Past Medical History:  Diagnosis Date  . Anxiety   . Depression   . Migraine   . Panic attack     Past Surgical History:  Procedure Laterality Date  . WISDOM TOOTH EXTRACTION      There were no vitals filed for this visit.   Subjective Assessment - 07/10/20 1300    Currently  in Pain? No/denies            OT Education - 07/10/20 1300    Education Details Educated on identifying worry and utilized circle on control tool to categorize what we do and do not have control over    Person(s) Educated Patient    Methods Explanation;Handout    Comprehension Verbalized understanding            OT Short Term Goals - 06/23/20 1400      OT SHORT TERM GOAL #1   Status On-going      OT SHORT TERM GOAL #2   Status On-going      OT SHORT TERM GOAL #3   Status On-going      OT SHORT TERM GOAL #4   Status On-going         Group Session:  S: "I got a job this weekend, so I guess you could say that is something I am worried about."  O: Group session encouraged increased participation and engagement through discussion focused on worry and our circle of control. Group reviewed a powerpoint that discussed healthy vs unhealthy worry with specific examples provided. Discussion also focused on utilizing the circle of control outline to identify what is within our control, what we have influence on, and what is not in our control. Group members shared specific examples and worries and identified what categories they fell in within the circle  of control.   A: Erika Durham was moderately engaged in today's session and required moderate-max verbal cues to engage in discussion. She initially shared that she did not have anything that she was worried about, however with encouragement and support, was able to share that she got a new job and that could cause her to worry. She shared that she was previously working at [employer], but left and has since been looking for a job and got hired this weekend to start at [employer]. She shared she used to work there so when she starts on Tuesday she won't need to be trained and this could cause worry around her performance. Pt was able to recognize what she can and cannot control about this situation - pt was mostly receptive to further support and  education received around the circle of control.   P: Continue to attend PHP OT group sessions 5x week for 1 weeks to promote daily structure, social engagement, and opportunities to develop and utilize adaptive strategies to maximize functional performance in preparation for safe transition and integration back into school, work, and the community. Plan to address topic of safety planning in next OT group session.   Plan - 07/10/20 1301    Occupational performance deficits (Please refer to evaluation for details): ADL's;IADL's;Rest and Sleep;Education;Work;Leisure;Social Participation    Body Structure / Function / Physical Skills ADL    Cognitive Skills Attention;Emotional;Energy/Drive;Learn;Memory;Perception;Problem Solve;Safety Awareness;Temperament/Personality;Thought;Understand    Psychosocial Skills Coping Strategies;Environmental  Adaptations;Habits;Interpersonal Interaction;Routines and Behaviors           Patient will benefit from skilled therapeutic intervention in order to improve the following deficits and impairments:   Body Structure / Function / Physical Skills: ADL Cognitive Skills: Attention,Emotional,Energy/Drive,Learn,Memory,Perception,Problem Solve,Safety Awareness,Temperament/Personality,Thought,Understand Psychosocial Skills: Coping Strategies,Environmental  Adaptations,Habits,Interpersonal Interaction,Routines and Behaviors   Visit Diagnosis: Difficulty coping  Frontal lobe and executive function deficit  Severe episode of recurrent major depressive disorder, without psychotic features (HCC)    Problem List There are no problems to display for this patient.   07/10/2020  Donne Hazel, MOT, OTR/L  07/10/2020, 1:01 PM  [facility] HOSPITALIZATION PROGRAM [address] [place], [place], [address] Phone: [phone]   Fax:  [phone]  Name: Erika Durham MRN: [id number] Date of Birth: 1996/01/18

## 2020-07-11 ENCOUNTER — Encounter (HOSPITAL_COMMUNITY): Payer: Self-pay

## 2020-07-11 ENCOUNTER — Other Ambulatory Visit: Payer: Self-pay

## 2020-07-11 ENCOUNTER — Other Ambulatory Visit (HOSPITAL_COMMUNITY): Payer: 59 | Admitting: Occupational Therapy

## 2020-07-11 ENCOUNTER — Other Ambulatory Visit (HOSPITAL_COMMUNITY): Payer: 59 | Admitting: Licensed Clinical Social Worker

## 2020-07-11 DIAGNOSIS — F332 Major depressive disorder, recurrent severe without psychotic features: Secondary | ICD-10-CM

## 2020-07-11 DIAGNOSIS — R45851 Suicidal ideations: Secondary | ICD-10-CM | POA: Diagnosis not present

## 2020-07-11 DIAGNOSIS — R41844 Frontal lobe and executive function deficit: Secondary | ICD-10-CM

## 2020-07-11 DIAGNOSIS — R4589 Other symptoms and signs involving emotional state: Secondary | ICD-10-CM

## 2020-07-11 NOTE — Therapy (Signed)
[facility] PARTIAL HOSPITALIZATION PROGRAM [address] [place], [place], [address] Phone: [phone]   Fax:  [phone] Virtual Visit via Video Note  I connected with Erika Durham on 07/11/20 at  11:00 AM EST by a video enabled telemedicine application and verified that I am speaking with the correct person using two identifiers.  Location: Patient: Patient Home Provider: Clinic Office   I discussed the limitations of evaluation and management by telemedicine and the availability of in person appointments. The patient expressed understanding and agreed to proceed.  I discussed the assessment and treatment plan with the patient. The patient was provided an opportunity to ask questions and all were answered. The patient agreed with the plan and demonstrated an understanding of the instructions.   The patient was advised to call back or seek an in-person evaluation if the symptoms worsen or if the condition fails to improve as anticipated.  I provided 60 minutes of non-face-to-face time during this encounter.   Erika Durham, OT   Occupational Therapy Treatment  Patient Details  Name: Erika Durham MRN: [id number] Date of Birth: 09-22-1995 Referring Provider (OT): Hillery Jacks   Encounter Date: 07/11/2020   OT End of Session - 07/11/20 1432    Visit Number 11    Number of Visits 20    Date for OT Re-Evaluation 07/20/20    Authorization Type [employer]    OT Start Time 1100    OT Stop Time 1200    OT Time Calculation (min) 60 min    Activity Tolerance Patient tolerated treatment well    Behavior During Therapy [facility] for tasks assessed/performed           Past Medical History:  Diagnosis Date  . Anxiety   . Depression   . Migraine   . Panic attack     Past Surgical History:  Procedure Laterality Date  . WISDOM TOOTH EXTRACTION      There were no vitals filed for this visit.   Subjective Assessment - 07/11/20 1432    Currently  in Pain? No/denies            OT Education - 07/11/20 1432    Education Details Educated on identifying coping strategies, social supports, and community mental health resources available through use of safety planning tool    Person(s) Educated Patient    Methods Explanation;Handout    Comprehension Verbalized understanding            OT Short Term Goals - 06/23/20 1400      OT SHORT TERM GOAL #1   Status On-going      OT SHORT TERM GOAL #2   Status On-going      OT SHORT TERM GOAL #3   Status On-going      OT SHORT TERM GOAL #4   Status On-going         Group Session:  S: "One of my warning signs is isolating myself."  O: Today's group discussion focused on the topic of Safety Planning. Patients were educated on what a safety plan is, what it can be used for, and why we should create one. Group then worked collaboratively and independently to create an individualized safety plan including identifying warning signs, coping strategies, places to go for distraction, social supports, professional supports, and how to make the environment safe. Group members were also encouraged to reflect on the question "What is life worth living for?" Group session ended with patients encouraged to utilize their  safety plan as an all-inclusive resource when experiencing a mental health crisis.    A: [name] was active in her participation of discussion and activity with moderate verbal cues and encouragement from this clinician to participate. Pt identified one of her warning signs as isolation and shared the best strategy for her to get through this is to surround herself with other people, even if that just means being in the presence of someone else and not talking. She identified [employer] as a place in the community she can go to for distraction. Appeared receptive to additional education and strategies surrounding benefit of safety planning.   P: Continue to attend PHP OT group sessions 5x  week for 2 weeks to promote daily structure, social engagement, and opportunities to develop and utilize adaptive strategies to maximize functional performance in preparation for safe transition and integration back into school, work, and the community.   Plan - 07/11/20 1433    Occupational performance deficits (Please refer to evaluation for details): ADL's;IADL's;Rest and Sleep;Education;Work;Leisure;Social Participation    Body Structure / Function / Physical Skills ADL    Cognitive Skills Attention;Emotional;Energy/Drive;Learn;Memory;Perception;Problem Solve;Safety Awareness;Temperament/Personality;Thought;Understand    Psychosocial Skills Coping Strategies;Environmental  Adaptations;Habits;Interpersonal Interaction;Routines and Behaviors           Patient will benefit from skilled therapeutic intervention in order to improve the following deficits and impairments:   Body Structure / Function / Physical Skills: ADL Cognitive Skills: Attention,Emotional,Energy/Drive,Learn,Memory,Perception,Problem Solve,Safety Awareness,Temperament/Personality,Thought,Understand Psychosocial Skills: Coping Strategies,Environmental  Adaptations,Habits,Interpersonal Interaction,Routines and Behaviors   Visit Diagnosis: Difficulty coping  Frontal lobe and executive function deficit  Severe episode of recurrent major depressive disorder, without psychotic features (HCC)    Problem List There are no problems to display for this patient.   07/11/2020  Erika Durham, MOT, OTR/L  07/11/2020, 2:33 PM  [facility] HOSPITALIZATION PROGRAM [address] [place], [place], [address] Phone: [phone]   Fax:  [phone]  Name: Erika Durham MRN: [id number] Date of Birth: 1995/05/27

## 2020-07-11 NOTE — Psych (Signed)
Virtual Visit via Video Note  I connected with Erika Durham on 07/11/20 at  9:00 AM EST by a video enabled telemedicine application and verified that I am speaking with the correct person using two identifiers.  Location: Patient: patient home Provider: Clinical home office   I discussed the limitations of evaluation and management by telemedicine and the availability of in person appointments. The patient expressed understanding and agreed to proceed.  I discussed the assessment and treatment plan with the patient. The patient was provided an opportunity to ask questions and all were answered. The patient agreed with the plan and demonstrated an understanding of the instructions.   The patient was advised to call back or seek an in-person evaluation if the symptoms worsen or if the condition fails to improve as anticipated.  Pt was provided 240 minutes of non-face-to-face time during this encounter.   [name]    [facility] PHP THERAPIST PROGRESS NOTE  Erika Durham [id number]  Session Time: 9:00 - 10:00  Participation Level: Active  Behavioral Response: CasualAlertDepressed  Type of Therapy: Group Therapy  Treatment Goals addressed: Coping  Interventions: CBT, DBT, Supportive and Reframing  Summary: Clinician led check-in regarding current stressors and situation.Clinician utilized active listening and empathetic response and validated patient emotions. Clinician facilitated processing group on pertinent issues.   Therapist Response: Erika Durham presented to group at an appropriate time. She reports that she is feeling sleepy this morning. Erika Durham rated her mood at a 5, however denied any current stressor or issues. She reports her current mood is typically her baseline. Reports she did not do anything interesting yesterday afternoon after group. Erika Durham denied having SI, HI, AVH.    Session Time: 10:00 - 11:00 Cognitive Distortions and Anxiety    Participation Level:Active  Behavioral Response:CasualAlertDepressed  Type of Therapy:GroupTherapy  Treatment Goals addressed: Coping  Interventions:CBT, DBT, Supportive and Reframing  Summary: Clients reviewed and engaged in the process of challenging, and changing, irrational thoughts associated with cognitive distortions.  Therapist Response: Reports she struggles with "Disqualifying the Positive" and "Emotional Reasoning" cognitive distortions. Erika Durham reports she becomes very anxious about her girlfriend leaving me due to Erika Durham believing she has many reasons to. Erika Durham shared that she focuses on the negatives of their relationship versus the positives. She also shared that she struggles with emotional reasoning because she at times feels like she is a bad partner due to her issues with depression and at times feels her partner feels the same way. Regarding anxiety associated with having cognitive distortions she reports that she believes that she has abandonment issues due to experiencing it before.     Session Time: 11:00- 12:00 Occupational Therapy   Participation Level:Active  Behavioral Response:CasualAlertDepressed  Type of Therapy: Group Therapy, OT  Treatment Goals addressed: Coping  Interventions:Psychosocial skills training, Supportive  Summary:Occupational Therapy group  Therapist Response:Patient engaged in group. See OT note.      Session Time: 12:00 -1:00 CBT Skill: Challenging Negative Thoughts   Participation Level:Active  Behavioral Response:CasualAlertDepressed  Type of Therapy:Group therapy  Treatment Goals addressed: Coping  Interventions:CBT; Solution focused; Supportive; Reframing   Therapist Response:12:00 - 12:50: Patient's reviewed cognitive behavioral skill of challenging negative thought patterns and processing their findings with clinician. Patient completed a Challenging Negative Thoughts  worksheet. Patients ended group session by watching Ted Talk Video - Getting Stuck in The Negatives (And How To Get Unstuck) by Lajean Saver.    12:50 - 1:00: Erika Durham reports she is feeling good after group  today. She rated her mood at a 6 at the end of the session.She shared that she starts her new job at [employer] today at 5:00p. Erika Durham states that she plans to be in group tomorrow morning and will let everyone know how her first day at work went.     Suicidal/Homicidal: Nowithout intent/plan  Plan: Pt will continue in PHP while working to decrease depression symptoms, decrease SI, and increase ability to manage symptoms in a healthy manner.   Diagnosis: No primary diagnosis found.    No diagnosis found.    [facility] PHP CLINIC 07/11/2020

## 2020-07-12 ENCOUNTER — Other Ambulatory Visit (HOSPITAL_COMMUNITY): Payer: 59

## 2020-07-12 ENCOUNTER — Other Ambulatory Visit: Payer: Self-pay

## 2020-07-13 ENCOUNTER — Telehealth (HOSPITAL_COMMUNITY): Payer: Self-pay | Admitting: Psychiatry

## 2020-07-13 ENCOUNTER — Encounter (HOSPITAL_COMMUNITY): Payer: Self-pay

## 2020-07-13 ENCOUNTER — Other Ambulatory Visit (HOSPITAL_COMMUNITY): Payer: 59

## 2020-07-13 ENCOUNTER — Other Ambulatory Visit (HOSPITAL_COMMUNITY): Payer: 59 | Admitting: Licensed Clinical Social Worker

## 2020-07-13 ENCOUNTER — Ambulatory Visit (HOSPITAL_COMMUNITY): Payer: 59

## 2020-07-13 ENCOUNTER — Other Ambulatory Visit: Payer: Self-pay

## 2020-07-13 ENCOUNTER — Other Ambulatory Visit (HOSPITAL_COMMUNITY): Payer: 59 | Admitting: Occupational Therapy

## 2020-07-13 DIAGNOSIS — R41844 Frontal lobe and executive function deficit: Secondary | ICD-10-CM

## 2020-07-13 DIAGNOSIS — F332 Major depressive disorder, recurrent severe without psychotic features: Secondary | ICD-10-CM

## 2020-07-13 DIAGNOSIS — R45851 Suicidal ideations: Secondary | ICD-10-CM | POA: Diagnosis not present

## 2020-07-13 DIAGNOSIS — R4589 Other symptoms and signs involving emotional state: Secondary | ICD-10-CM

## 2020-07-13 NOTE — Therapy (Addendum)
[name], [place], [address] Phone: [phone]   Fax:  [phone] Virtual Visit via Video Note   I connected with [name] on 07/14/20 at  11:00 AM EST by a video enabled telemedicine application and verified that I am speaking with the correct person using two identifiers.  Location: Patient: Patient Home Provider: Clinic Office   I discussed the limitations of evaluation and management by telemedicine and the availability of in person appointments. The patient expressed understanding and agreed to proceed.   I discussed the assessment and treatment plan with the patient. The patient was provided an opportunity to ask questions and all were answered. The patient agreed with the plan and demonstrated an understanding of the instructions.   The patient was advised to call back or seek an in-person evaluation if the symptoms worsen or if the condition fails to improve as anticipated.  I provided 45 minutes of non-face-to-face time during this encounter.   Ponciano Ort, OT   Occupational Therapy Treatment  Patient Details  Name: Erika Durham MRN: [id number] Date of Birth: 11/30/1995 Referring Provider (OT): [name]   Encounter Date: 07/13/2020   OT End of Session - 07/13/20 1156    Visit Number 12    Number of Visits 20    Date for OT Re-Evaluation 07/20/20    Authorization Type [employer]    OT Start Time 1100    OT Stop Time 1145    OT Time Calculation (min) 45 min    Activity Tolerance Patient tolerated treatment well    Behavior During Therapy [facility] for tasks assessed/performed           Past Medical History:  Diagnosis Date  . Anxiety   . Depression   . Migraine   . Panic attack     Past Surgical History:  Procedure Laterality Date  . WISDOM TOOTH EXTRACTION      There were no vitals filed for this visit.   Subjective Assessment - 07/13/20 1156     Currently in Pain? No/denies           OT Education - 07/13/20 1156    Education Details Educated on low vs positive self-esteem and reviewed six strategies to boost low self-esteem    Person(s) Educated Patient    Methods Explanation;Handout    Comprehension Verbalized understanding            OT Short Term Goals - 07/14/20 1001      OT SHORT TERM GOAL #1   Title Pt will actively engage in OT group sessions throughout duration of PHP programming, in order to promote daily structure, social engagement, and opportunities to develop and utilize adaptive strategies to maximize functional performance in preparation for safe transition and integration back into school, work, and the community.    Status Achieved      OT SHORT TERM GOAL #2   Title Pt will practice and identify 1-3 adaptive coping strategies she can utilize, in order to safely manage increased depression/anxiety, without resulting in panic/anxiety attack, with min cues, in preparation for safe and healthy reintegration back into the community at discharge.    Status Achieved      OT SHORT TERM GOAL #3   Title Pt will identify and implement 1-3 sleep hygiene strategies she can utilize, in order to improve sleep quality/ADL performance, in preparation for safe and healthy reintegration back into the community at discharge.    Status  Achieved      OT SHORT TERM GOAL #4   Title Pt will identify 1-3 ways to structure her free time, in order to promote re-engagement in preferred leisure interests and establishment of a daily routine, in preparation for community reintegration.    Status Achieved         Group Session:  S: "I think my self-esteem is consistently low, but it dips when I'm in a depressive state or episode also."  O: Today's group session encouraged increased engagement and participation through discussion focused on self-esteem. Topic of discussion focused on identifying differences between low vs high  self-esteem and identified ways in which our self-esteem impacts our daily lives including: self-criticism, ignoring positive qualities, negative emotions, work/school performance, relationships, leisure engagement, and self-care. Group members further identified ways in which their self-esteem directly impacts their daily function. Tips and strategies were shared to boost and build-up our low self-esteem, including exercise, mindfulness/meditation, postures of confidence, surrounding yourself with positive people, positive affirmations, and self-care. Group members committed to trying one of the above-mentioned strategies over their weekend.    A: Rim was active in her participation of discussion and activity, engaged for duration despite being the only group member present for today's session. She shared that her self-esteem is "consistently low" and shared that it dips when she is in a depressive episode. She appeared receptive to habits and strategies reviewed to boost and improve self-esteem. She expressed interest in use of Zentangle as a method to work on mindfulness, in addition to positive affirmations. Pt identified positive traits about herself as loyal, accepting, friendly, good listener, generous, and grateful. Receptive to further strategies and education provided.   P: Continue to attend PHP OT group sessions 5x week for the remainder of the week to promote daily structure, social engagement, and opportunities to develop and utilize adaptive strategies to maximize functional performance in preparation for safe transition and integration back into school, work, and the community. Plan to address topic of self-care in next OT group session.   OCCUPATIONAL THERAPY DISCHARGE SUMMARY  Visits from Start of Care: 12  Current functional level related to goals / functional outcomes: Pt has made significant progress while in the PHP program, identifying and utilizing several new coping skills  and obtained a new job while with [place]. [name] has met 4/4 OT goals and is scheduled and ready for discharge, effective 07/13/2020 with plan to follow up and attend [place] IOP beginning Monday 07/17/2020 to address ongoing therapy and treatment needs.    Remaining deficits: See above    Education / Equipment: See above   Plan: Patient agrees to discharge.  Patient goals were met. Patient is being discharged due to meeting the stated rehab goals.  ?????        Plan - 07/13/20 1157    Occupational performance deficits (Please refer to evaluation for details): ADL's;IADL's;Rest and Sleep;Education;Work;Leisure;Social Participation    Body Structure / Function / Physical Skills ADL    Cognitive Skills Attention;Emotional;Energy/Drive;Learn;Memory;Perception;Problem Solve;Safety Awareness;Temperament/Personality;Thought;Understand    Psychosocial Skills Coping Strategies;Environmental  Adaptations;Habits;Interpersonal Interaction;Routines and Behaviors           Patient will benefit from skilled therapeutic intervention in order to improve the following deficits and impairments:   Body Structure / Function / Physical Skills: ADL Cognitive Skills: Attention,Emotional,Energy/Drive,Learn,Memory,Perception,Problem Solve,Safety Awareness,Temperament/Personality,Thought,Understand Psychosocial Skills: Coping Strategies,Environmental  Adaptations,Habits,Interpersonal Interaction,Routines and Behaviors   Visit Diagnosis: Difficulty coping  Frontal lobe and executive function deficit  Severe episode of recurrent major depressive  disorder, without psychotic features ([place])    Problem List There are no problems to display for this patient.   07/14/2020  Ponciano Ort, MOT, OTR/L  07/14/2020, 10:02 AM  [place], [place], [address] Phone: [phone]   Fax:  [phone]  Name: Vicy Medico MRN:  [id number] Date of Birth: [birth year]

## 2020-07-13 NOTE — Psych (Signed)
Virtual Visit via Video Note  I connected with Erika Durham on 07/11/20 at  9:00 AM EST by a video enabled telemedicine application and verified that I am speaking with the correct person using two identifiers.  Location: Patient: patient home Provider: Clinical home office   I discussed the limitations of evaluation and management by telemedicine and the availability of in person appointments. The patient expressed understanding and agreed to proceed.  I discussed the assessment and treatment plan with the patient. The patient was provided an opportunity to ask questions and all were answered. The patient agreed with the plan and demonstrated an understanding of the instructions.   The patient was advised to call back or seek an in-person evaluation if the symptoms worsen or if the condition fails to improve as anticipated.  Pt was provided 240 minutes of non-face-to-face time during this encounter.   Erika Durham    [facility] PHP THERAPIST PROGRESS NOTE  Erika Durham [id number]  Session Time: 9:00 - 10:00  Participation Level: Active  Behavioral Response: CasualAlertDepressed  Type of Therapy: Group Therapy  Treatment Goals addressed: Coping  Interventions: CBT, DBT, Supportive and Reframing  Summary: Clinician led check-in regarding current stressors and situation.Clinician utilized active listening and empathetic response and validated patient emotions. Clinician facilitated processing group on pertinent issues.   Therapist Response: Erika Durham presented to group at an appropriate time. She reports that she is feeling sleepy this morning. Erika Durham rated her mood at a 5, however denied any current stressor or issues. She reports her current mood is typically her baseline. Reports she did not do anything interesting yesterday afternoon after group. Erika Durham denied having SI, HI, AVH.    Session Time: 10:00 - 11:00 Cognitive Distortions and Anxiety    Participation Level:Active  Behavioral Response:CasualAlertDepressed  Type of Therapy:GroupTherapy  Treatment Goals addressed: Coping  Interventions:CBT, DBT, Supportive and Reframing  Summary: Clients reviewed and engaged in the process of challenging, and changing, irrational thoughts associated with cognitive distortions.  Therapist Response: Erika Durham reports that stress can affect everything physically, emotionally, spiritually, etc. Erika Durham reports that lack of finances is her main stressors. She reports this is the largest source of stress due to her inability to pay for bills, gas, effecting her credit score and security of housing.  Erika Durham reports that her dropping out of school and her inability to manage her depression are two additional stressors that she is currently experiencing.   Anger Frustration Anxiety Low sex drive Drug or Alcohol use  Identified the following people as supports:  Girlfriend - Assist in cleaning the house  Sister - Can vent to sister, a good listener  Best friend - Assist in cleaning the house   Emotional Management - Reports she has the habit of going to [employer] when she is stressed. She reports she indulges in impulsive spending when she feels stressed. Was able to identify that she learned to manage her emotions through retail therapy from observing her mother's behavior when she was a child. Patient also reviewed the stress management skills of addressing  Life Balance and Basic Needs.     Session Time: 11:00- 12:00 Occupational Therapy   Participation Level:Active  Behavioral Response:CasualAlertDepressed  Type of Therapy: Group Therapy, OT  Treatment Goals addressed: Coping  Interventions:Psychosocial skills training, Supportive  Summary:Occupational Therapy group  Therapist Response:Patient engaged in group. See OT note.      Session Time: 12:00 -1:00 Relaxation Skills   Participation  Level:Active  Behavioral Response:CasualAlertDepressed  Type of  Therapy:Group therapy  Treatment Goals addressed: Coping  Interventions:CBT; Solution focused; Supportive; Reframing   Therapist Response:12:00 - 12:50:  Patient reviewed the following relaxation skills to enhance tools for the treatment of stress, anxiety, and anger. Techniques reviewed were: Mindfulness Meditation, Deep Breathing, Progressive Muscle Relaxation and Visualization.   12:50 - 1:00: Erika Durham reports she is feeling good after group today. She rated her mood at a 6 at the end of the session.She shared that she will be returning to her new job at [employer] today at 5:00p. Marriah states that she plans to be in group tomorrow morning. Erika Durham denied any SI, HI, AVH.    Suicidal/Homicidal: Nowithout intent/plan  Plan: Pt will continue in PHP while working to decrease depression symptoms, decrease SI, and increase ability to manage symptoms in a healthy manner.   Diagnosis: No primary diagnosis found.    No diagnosis found.    [facility] 07/13/2020

## 2020-07-14 ENCOUNTER — Other Ambulatory Visit (HOSPITAL_COMMUNITY): Payer: 59

## 2020-07-14 ENCOUNTER — Other Ambulatory Visit (HOSPITAL_COMMUNITY): Payer: 59 | Admitting: Licensed Clinical Social Worker

## 2020-07-14 ENCOUNTER — Ambulatory Visit (HOSPITAL_COMMUNITY): Payer: 59

## 2020-07-14 ENCOUNTER — Other Ambulatory Visit: Payer: Self-pay

## 2020-07-14 DIAGNOSIS — F332 Major depressive disorder, recurrent severe without psychotic features: Secondary | ICD-10-CM

## 2020-07-17 ENCOUNTER — Encounter (HOSPITAL_COMMUNITY): Payer: Self-pay | Admitting: Family

## 2020-07-17 ENCOUNTER — Other Ambulatory Visit: Payer: Self-pay

## 2020-07-17 ENCOUNTER — Other Ambulatory Visit (HOSPITAL_COMMUNITY): Payer: 59 | Admitting: Psychiatry

## 2020-07-17 DIAGNOSIS — F332 Major depressive disorder, recurrent severe without psychotic features: Secondary | ICD-10-CM | POA: Diagnosis not present

## 2020-07-17 DIAGNOSIS — R45851 Suicidal ideations: Secondary | ICD-10-CM | POA: Diagnosis not present

## 2020-07-17 NOTE — Progress Notes (Signed)
Virtual Visit via Video Note  I connected with Erika Durham on @TODAY @ at  9:00 AM EDT by a video enabled telemedicine application and verified that I am speaking with the correct person using two identifiers.  Location: Patient: at home Provider: at office   I discussed the limitations of evaluation and management by telemedicine and the availability of in person appointments. The patient expressed understanding and agreed to proceed.   I discussed the assessment and treatment plan with the patient. The patient was provided an opportunity to ask questions and all were answered. The patient agreed with the plan and demonstrated an understanding of the instructions.   The patient was advised to call back or seek an in-person evaluation if the symptoms worsen or if the condition fails to improve as anticipated.  I provided 30 minutes of non-face-to-face time during this encounter.   , M.Ed,CNA   Patient ID: Erika Durham, female   DOB: 01/21/1996, 25 y.o.   MRN: [id number] As per previous CCA:  Pt presents as referral from [facility] due to increased depression and SI with plan. Pt states she has not been attending her college classes, has decline in ADLs, has relapsed with cutting self-harm, and is experiencing SI with no current  intent. Pt reports history of depression, anxiety, ADHD, PTSD, and anorexia. Pt states one inpatient admission at age 18 and last year she completed IOP/PHP at [facility]. Pt states she has a therapist, [facility], who she sees 2x/week, however has not been on medication for the past year until [facility] started her on Effexor and Trazadone approximately 1 week ago. Pt states she is tolerating the medication well. Pt reports she smokes marijuana occassionally with last use 3 weeks ago. Pt denies AVH.  Current Symptoms/Problems: Pt reports depressed mood, tearfulness, decreased motivation, low energy, hopelessness, recurrence of self-harm thoughts, passive  SI, increased anxiety, poor sleep, decreased appetite.  Pt transitioned from MH-IOP.  According to [facility], NP note; pt attended and participated in [facility].  Discontinued Effexor and started on Lexapro.  Denies SI/HI or A/V hallucinations. Pt voiced that her dropping out of school and finances are her main stressors at this time. A:  Admit to MH-IOP.  Pt gave verbal consent for treatment, to release chart information to referred provider and to complete any forms if needed.  Pt also gave consent for attending group virtually d/t COVID-19 social distancing restrictions.  Encouraged support groups.  F/U with [facility] (therapist).  Refer pt to a psychiatrist.  R:  Pt receptive.  [name], M.Ed,CNA
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
## 2020-07-18 ENCOUNTER — Ambulatory Visit (HOSPITAL_COMMUNITY): Payer: 59

## 2020-07-18 ENCOUNTER — Telehealth (HOSPITAL_COMMUNITY): Payer: Self-pay | Admitting: Psychiatry

## 2020-07-19 ENCOUNTER — Encounter (HOSPITAL_COMMUNITY): Payer: Self-pay | Admitting: Psychiatry

## 2020-07-19 ENCOUNTER — Ambulatory Visit (HOSPITAL_COMMUNITY): Payer: 59

## 2020-07-19 ENCOUNTER — Other Ambulatory Visit: Payer: Self-pay

## 2020-07-19 ENCOUNTER — Other Ambulatory Visit (HOSPITAL_COMMUNITY): Payer: 59 | Admitting: Psychiatry

## 2020-07-19 ENCOUNTER — Telehealth (HOSPITAL_COMMUNITY): Payer: Self-pay | Admitting: Psychiatry

## 2020-07-19 NOTE — Progress Notes (Signed)
Virtual Visit via Video Note  I connected with Erika Durham on 07/17/20 at  9:00 AM EDT by a video enabled telemedicine application and verified that I am speaking with the correct person using two identifiers.  At orientation to the IOP program, Case Manager discussed the limitations of evaluation and management by telemedicine and the availability of in person appointments. The patient expressed understanding and agreed to proceed with virtual visits throughout the duration of the program.   Location:  Patient: Patient Home Provider: Home Office   History of Present Illness: MDD  Observations/Objective: Check In: Case Manager checked in with all participants to review discharge dates, insurance authorizations, work-related documents and needs from the treatment team regarding medications. Client stated needs and engaged in discussion. Case Manager introduced a new Client to the group, with group members making introductions and starting the joining process.   Initial Therapeutic Activity: Counselor facilitated therapeutic processing with group members to assess mood and current functioning, prompting group members to share about application of skills, progress and challenges in treatment/personal lives. Client joined for their first session today, sharing what brings them to treatment, treatment expectations and about support system. Client presents with moderate depression and moderate anxiety. Client denied any current SI/HI/psychosis.  Second Therapeutic Activity: Counselor provided group with psychoeducation on strategies for managing and preventing high-levels of stress in the work place. Group members shared about their specific stressors related to work and the areas they would like to address. Counselor reviewed an APA articles and shared a video from [employer] with outside of the box ideas on coping strategies for the workplace. Group members shared which ones resonated with  them the most and their plan for application.   Check Out: Client encouraged group members to share one productivity activity or one self-care practice they can engage in today. Client and other group members shared before dismissing from session. Client endorsed safety plan to be followed to prevent safety issues.   Assessment and Plan: Clinician recommends that Client remain in IOP treatment to better manage mental health symptoms, stabilization and to address treatment plan goals. Clinician recommends adherence to crisis/safety plan, taking medications as prescribed, and following up with medical professionals if any issues arise.   Follow Up Instructions: Clinician will send Webex link for next session. The Client was advised to call back or seek an in-person evaluation if the symptoms worsen or if the condition fails to improve as anticipated.     I provided 180 minutes of non-face-to-face time during this encounter.     Hilbert Odor, LCSW

## 2020-07-20 ENCOUNTER — Other Ambulatory Visit: Payer: Self-pay

## 2020-07-20 ENCOUNTER — Other Ambulatory Visit (HOSPITAL_COMMUNITY): Payer: 59 | Admitting: Psychiatry

## 2020-07-20 ENCOUNTER — Ambulatory Visit (HOSPITAL_COMMUNITY): Payer: 59

## 2020-07-20 ENCOUNTER — Encounter (HOSPITAL_COMMUNITY): Payer: Self-pay

## 2020-07-20 DIAGNOSIS — R45851 Suicidal ideations: Secondary | ICD-10-CM | POA: Diagnosis not present

## 2020-07-20 DIAGNOSIS — F332 Major depressive disorder, recurrent severe without psychotic features: Secondary | ICD-10-CM

## 2020-07-20 NOTE — Progress Notes (Signed)
Virtual Visit via Video Note  I connected with Erika Durham on 07/20/20 at  9:00 AM EDT by a video enabled telemedicine application and verified that I am speaking with the correct person using two identifiers.  At orientation to the IOP program, Case Manager discussed the limitations of evaluation and management by telemedicine and the availability of in person appointments. The patient expressed understanding and agreed to proceed with virtual visits throughout the duration of the program.   Location:  Patient: Patient Home Provider: Home Office   History of Present Illness: MDD  Observations/Objective: Check In: Case Manager checked in with all participants to review discharge dates, insurance authorizations, work-related documents and needs from the treatment team regarding medications. Client stated needs and engaged in discussion. Case Manager introduced a new Client to the group, with group members making introductions and starting the joining process.   Initial Therapeutic Activity: Counselor introduced [employer], [place] Cha[employer] to present information and discussion on Grief and Loss. Group members engaged in discussion, sharing how grief impacts them, what comforts them, what emotions are felt, labeling losses, etc. After guest speaker logged off, Counselor prompted group to spend 10-15 minutes journaling to process personal grief and loss situations. Counselor processed entries with group and client's identified areas for additional processing in individual therapy. Client noted grief and loss related to loss of parent and stage of life issues.  Second Therapeutic Activity: Counselor facilitated therapeutic processing with group members to assess mood and current functioning, prompting group members to share about application of skills, progress and challenges in treatment/personal lives. Client provided updates and chose specific issues to process and receive feedback on  strategies. Client presents with moderate depression and moderate anxiety. Client denied any current SI/HI/psychosis.  Check Out: Counselor closed program by allowing time to celebrate a graduating group member. Counselor shared reflections on progress and allow space for group members to share well wishes and appreciates to the graduating client. Counselor prompted graduating client to share takeaways, reflect on progress and final thoughts for the group. Client endorsed safety plan to be followed to prevent safety issues.   Assessment and Plan: Clinician recommends that Client remain in IOP treatment to better manage mental health symptoms, stabilization and to address treatment plan goals. Clinician recommends adherence to crisis/safety plan, taking medications as prescribed, and following up with medical professionals if any issues arise.   Follow Up Instructions: Clinician will send Webex link for next session. The Client was advised to call back or seek an in-person evaluation if the symptoms worsen or if the condition fails to improve as anticipated.     I provided 180 minutes of non-face-to-face time during this encounter.     Hilbert Odor, LCSW

## 2020-07-21 ENCOUNTER — Other Ambulatory Visit: Payer: Self-pay

## 2020-07-21 ENCOUNTER — Encounter (HOSPITAL_COMMUNITY): Payer: Self-pay

## 2020-07-21 ENCOUNTER — Other Ambulatory Visit (HOSPITAL_COMMUNITY): Payer: 59 | Admitting: Psychiatry

## 2020-07-21 DIAGNOSIS — R45851 Suicidal ideations: Secondary | ICD-10-CM | POA: Diagnosis not present

## 2020-07-21 DIAGNOSIS — F332 Major depressive disorder, recurrent severe without psychotic features: Secondary | ICD-10-CM | POA: Diagnosis not present

## 2020-07-21 NOTE — Progress Notes (Signed)
Virtual Visit via Video Note  I connected with Erika Durham on 07/21/20 at  9:00 AM EDT by a video enabled telemedicine application and verified that I am speaking with the correct person using two identifiers.  At orientation to the IOP program, Case Manager discussed the limitations of evaluation and management by telemedicine and the availability of in person appointments. The patient expressed understanding and agreed to proceed with virtual visits throughout the duration of the program.   Location:  Patient: Patient Home Provider: Home Office   History of Present Illness: MDD  Observations/Objective: Check In: Case Manager checked in with all participants to review discharge dates, insurance authorizations, work-related documents and needs from the treatment team regarding medications. Client stated needs and engaged in discussion.   Initial Therapeutic Activity: Counselor facilitated therapeutic processing with group members to assess mood and current functioning, prompting group members to share about application of skills, progress and challenges in treatment/personal lives. Client provided updates and chose specific issues to process and receive feedback on strategies. Client presents with moderate depression and moderate anxiety. Client denied any current SI/HI/psychosis.  Second Therapeutic Activity: Counselor shared psychoducation on Understanding the Brain and Mental Illness. Group discussed learnings and application of new perspective. Counselor Provided a video on 25 Coping Skills for Anxiety and Depression and how to properly apply coping skills to regulate emotions in order to resolve problems. Group members each identified 3 coping skills and settings to apply.   Check Out: Counselor prompted each group member to share their plans for the weekend and to discuss any pending concerns. Client stated they are looking forward to returning to group on Monday. Client endorsed  safety plan to be followed to prevent safety issues.   Assessment and Plan: Clinician recommends that Client remain in IOP treatment to better manage mental health symptoms, stabilization and to address treatment plan goals. Clinician recommends adherence to crisis/safety plan, taking medications as prescribed, and following up with medical professionals if any issues arise.   Follow Up Instructions: Clinician will send Webex link for next session. The Client was advised to call back or seek an in-person evaluation if the symptoms worsen or if the condition fails to improve as anticipated.     I provided 180 minutes of non-face-to-face time during this encounter.     Hilbert Odor, LCSW

## 2020-07-23 ENCOUNTER — Other Ambulatory Visit (HOSPITAL_COMMUNITY): Payer: Self-pay | Admitting: Family

## 2020-07-24 ENCOUNTER — Ambulatory Visit (HOSPITAL_COMMUNITY): Payer: 59

## 2020-07-24 ENCOUNTER — Other Ambulatory Visit (HOSPITAL_COMMUNITY): Payer: 59 | Admitting: Psychiatry

## 2020-07-24 ENCOUNTER — Other Ambulatory Visit: Payer: Self-pay

## 2020-07-24 ENCOUNTER — Encounter (HOSPITAL_COMMUNITY): Payer: Self-pay | Admitting: Psychiatry

## 2020-07-24 DIAGNOSIS — F332 Major depressive disorder, recurrent severe without psychotic features: Secondary | ICD-10-CM | POA: Diagnosis not present

## 2020-07-24 DIAGNOSIS — R45851 Suicidal ideations: Secondary | ICD-10-CM | POA: Diagnosis not present

## 2020-07-24 NOTE — Progress Notes (Signed)
Virtual Visit via Video Note  I connected with Erika Durham on 07/24/20 at  9:00 AM EDT by a video enabled telemedicine application and verified that I am speaking with the correct person using two identifiers.  At orientation to the IOP program, Case Manager discussed the limitations of evaluation and management by telemedicine and the availability of in person appointments. The patient expressed understanding and agreed to proceed with virtual visits throughout the duration of the program.   Location:  Patient: Patient Home Provider: Home Office   History of Present Illness: MDD  Observations/Objective: Check In: Case Manager checked in with all participants to review discharge dates, insurance authorizations, work-related documents and needs from the treatment team regarding medications. Client stated needs and engaged in discussion. Case Manager introduced a new Client to the group, with group members making introductions and starting the joining process.   Initial Therapeutic Activity: Counselor facilitated therapeutic processing with group members to assess mood and current functioning, prompting group members to share about application of skills, progress and challenges in treatment/personal lives. Client provided updates and chose specific issues to process and receive feedback on strategies. Client presents with moderate depression and mild anxiety. Client denied any current SI/HI/psychosis.  Second Therapeutic Activity: Counselor facilitated the creation of a Mental Health Safety Plan with the Group. Client walked group through the various sections, prompting them to customize and make it their own, including coping skills, triggers, supports, professionals and motivations to live. Group members shared their individual results. Client engaged well in the activity.   Check Out: Counselor prompted each group member to share their plans for the day/week and to discuss any pending  concerns. Client stated they are looking forward to returning to group on tomorrow. Client endorsed safety plan to be followed to prevent safety issues.   Assessment and Plan: Clinician recommends that Client remain in IOP treatment to better manage mental health symptoms, stabilization and to address treatment plan goals. Clinician recommends adherence to crisis/safety plan, taking medications as prescribed, and following up with medical professionals if any issues arise.   Follow Up Instructions: Clinician will send Webex link for next session. The Client was advised to call back or seek an in-person evaluation if the symptoms worsen or if the condition fails to improve as anticipated.     I provided 180 minutes of non-face-to-face time during this encounter.     Hilbert Odor, LCSW

## 2020-07-24 NOTE — Psych (Signed)
Virtual Visit via Video Note  I connected with Erika Durham on 06/26/20 at  9:00 AM EST by a video enabled telemedicine application and verified that I am speaking with the correct person using two identifiers.  Location: Patient: patient home Provider: Clinical home office   I discussed the limitations of evaluation and management by telemedicine and the availability of in person appointments. The patient expressed understanding and agreed to proceed.  I discussed the assessment and treatment plan with the patient. The patient was provided an opportunity to ask questions and all were answered. The patient agreed with the plan and demonstrated an understanding of the instructions.   The patient was advised to call back or seek an in-person evaluation if the symptoms worsen or if the condition fails to improve as anticipated.  Pt was provided 240 minutes of non-face-to-face time during this encounter.   Donia Guiles, LCSW    [facility] PHP THERAPIST PROGRESS NOTE  Erika Durham [id number]  Session Time: 9:00 - 10:00  Participation Level: Active  Behavioral Response: CasualAlertDepressed  Type of Therapy: Group Therapy  Treatment Goals addressed: Coping  Interventions: CBT, DBT, Supportive and Reframing  Summary: Clinician led check-in regarding current stressors and situation.Clinician utilized active listening and empathetic response and validated patient emotions. Clinician facilitated processing group on pertinent issues.   Therapist Response:  Erika Durham is a 25 y.o. female who presents with depression symptoms.  Patient arrived within time allowed and reports that she is feeling "not good." Patient rates hermood at a2on a scale of 1-10 with 10 being great. Pt reportsshe is feeling ill today, congested and nauseous. Pt states her weekend was bad due to dynamics with her mom and spending the weekend around her.  Pt states experiencing SI and self-harm over  the weekend and tried to talk to her sister and take a drive to manage. Pt reports she slept poorly over the weekend. Pt able to process. Pt engaged in discussion.     Session Time: 10:00 - 11:00   Participation Level:Active  Behavioral Response:CasualAlertDepressed  Type of Therapy:GroupTherapy  Treatment Goals addressed: Coping  Interventions:CBT, DBT, Supportive and Reframing  Summary:Cln facilitated processing group around difficult relationships. Group members shared struggles they face in relationships. Cln brought in topics of self-esteem, CBT thought challenging, boundaries, and communication to aid growth.  Therapist Response:  Pt engaged in discussion and is able to process around the relationship with her mom.      Session Time: 11:00- 12:00  Participation Level:Active  Behavioral Response:CasualAlertDepressed  Type of Therapy: Group Therapy, OT  Treatment Goals addressed: Coping  Interventions:Psychosocial skills training, Supportive  Summary:Occupational Therapy group  Therapist Response:Patient engaged in group. See OT note.      Session Time: 12:00 -1:00  Participation Level:Active  Behavioral Response:CasualAlertDepressed  Type of Therapy:Group therapy  Treatment Goals addressed: Coping  Interventions:CBT; Solution focused; Supportive; Reframing  Summary:12:00 - 12:50: Cln led discussion on putting yourself first. Group members shared unhealthy situations they are dealing with and how "choosing me" looks amid a situation they cannot control. Cln brought in aspects of boundaries and self-esteem to aid discussion.  12:50 -1:00 Clinician led check-out. Clinician assessed for immediate needs, medication compliance and efficacy, and safety concerns  Therapist Response:12:00 - 12:50: Pt engaged in discussion and is able to apply family relationships to the conversation.  12:50 - 1:00: At check-out,  patientrates hermood at [facility] a scale of 1-10 with 10 being great.Pt reports afternoon plans ofdoing art. Pt demonstrates some  progress as evidenced by managing self harm thoughts over weekend. Patient denies SI/HI at the end of group.    Suicidal/Homicidal: Nowithout intent/plan  Plan: Pt will continue in PHP while working to decrease depression symptoms, decrease SI, and increase ability to manage symptoms in a healthy manner.   Diagnosis: Severe episode of recurrent major depressive disorder, without psychotic features (HCC) [F33.2]    1. Severe episode of recurrent major depressive disorder, without psychotic features (HCC)       Donia Guiles, LCSW 07/24/2020

## 2020-07-25 ENCOUNTER — Other Ambulatory Visit (HOSPITAL_COMMUNITY): Payer: 59 | Admitting: Psychiatry

## 2020-07-25 ENCOUNTER — Encounter (HOSPITAL_COMMUNITY): Payer: Self-pay

## 2020-07-25 ENCOUNTER — Other Ambulatory Visit: Payer: Self-pay

## 2020-07-25 DIAGNOSIS — F332 Major depressive disorder, recurrent severe without psychotic features: Secondary | ICD-10-CM

## 2020-07-25 DIAGNOSIS — R45851 Suicidal ideations: Secondary | ICD-10-CM | POA: Diagnosis not present

## 2020-07-25 NOTE — Progress Notes (Signed)
Virtual Visit via Video Note  I connected with Erika Durham on 07/25/20 at  9:00 AM EDT by a video enabled telemedicine application and verified that I am speaking with the correct person using two identifiers.  At orientation to the IOP program, Case Manager discussed the limitations of evaluation and management by telemedicine and the availability of in person appointments. The patient expressed understanding and agreed to proceed with virtual visits throughout the duration of the program.   Location:  Patient: Patient Home Provider: Home Office   History of Present Illness: MDD  Observations/Objective: Check In: Case Manager checked in with all participants to review discharge dates, insurance authorizations, work-related documents and needs from the treatment team regarding medications. Client stated needs and engaged in discussion. Case Manager introduced a new Client to the group, with group members making introductions and starting the joining process.  Initial Therapeutic Activity: Counselor facilitated therapeutic processing with group members to assess mood and current functioning, prompting group members to share about application of skills, progress and challenges in treatment/personal lives. Client provided updates and chose specific issues to process and receive feedback on strategies. Client presents with moderate depression and moderate anxiety. Client denied any current SI/HI/psychosis.  Second Therapeutic Activity: Counselor facilitated the creation of a Mental Health Safety Plan with the Group. Client walked group through the various sections, prompting them to customize and make it their own, including coping skills, triggers, supports, professionals and motivations to live. Client identified who they will share their plan with and their ideas for implementation. Client engaged well in the activity.   Check Out: Counselor closed program by allowing time to celebrate a  graduating group member. Counselor shared reflections on progress and allow space for group members to share well wishes and appreciates to the graduating client. Counselor prompted graduating client to share takeaways, reflect on progress and final thoughts for the group. Client endorsed safety plan to be followed to prevent safety issues.   Assessment and Plan: Clinician recommends that Client remain in IOP treatment to better manage mental health symptoms, stabilization and to address treatment plan goals. Clinician recommends adherence to crisis/safety plan, taking medications as prescribed, and following up with medical professionals if any issues arise.   Follow Up Instructions: Clinician will send Webex link for next session. The Client was advised to call back or seek an in-person evaluation if the symptoms worsen or if the condition fails to improve as anticipated.     I provided 180 minutes of non-face-to-face time during this encounter.     Hilbert Odor, LCSW

## 2020-07-25 NOTE — Psych (Signed)
Virtual Visit via Video Note  I connected with Erika Durham on 06/27/20 at  9:00 AM EST by a video enabled telemedicine application and verified that I am speaking with the correct person using two identifiers.  Location: Patient: patient home Provider: Clinical home office   I discussed the limitations of evaluation and management by telemedicine and the availability of in person appointments. The patient expressed understanding and agreed to proceed.  I discussed the assessment and treatment plan with the patient. The patient was provided an opportunity to ask questions and all were answered. The patient agreed with the plan and demonstrated an understanding of the instructions.   The patient was advised to call back or seek an in-person evaluation if the symptoms worsen or if the condition fails to improve as anticipated.  Pt was provided 240 minutes of non-face-to-face time during this encounter.   Donia Guiles, LCSW    [facility] PHP THERAPIST PROGRESS NOTE  Erika Durham [id number]  Session Time: 9:00 - 10:00  Participation Level: Active  Behavioral Response: CasualAlertDepressed  Type of Therapy: Group Therapy  Treatment Goals addressed: Coping  Interventions: CBT, DBT, Supportive and Reframing  Summary: Clinician led check-in regarding current stressors and situation.Clinician utilized active listening and empathetic response and validated patient emotions. Clinician facilitated processing group on pertinent issues.   Therapist Response:  Erika Durham is a 25 y.o. female who presents with depression symptoms.  Patient arrived within time allowed and reports that she is feeling "pretty bad." Patient rates hermood at a2on a scale of 1-10 with 10 being great. Pt reportsshe slept about 1 hour last night and is "exhausted." Pt states she woke up to a panic attack and was unable to fall back asleep. Pt reports experiencing self-harm and SI last night which she  managed by looking through [facility]. Pt reports she is considering residential treatment.  Pt able to process. Pt engaged in discussion.      Session Time: 10:00 - 11:00   Participation Level:Active  Behavioral Response:CasualAlertDepressed  Type of Therapy:GroupTherapy  Treatment Goals addressed: Coping  Interventions:CBT, DBT, Supportive and Reframing  Summary:Cln led discussion on deep breathing and its therapeutic benefits, using DBT TIPP skills to inform discussion. Group practiced how to breathe from their diaphragms to ensure therapuetic quality and different ways to keep track of regulating breaths.   Therapist Response: Pt engaged in discussion and practice.      Session Time: 11:00- 12:00  Participation Level:Active  Behavioral Response:CasualAlertDepressed  Type of Therapy: Group Therapy, OT  Treatment Goals addressed: Coping  Interventions:Psychosocial skills training, Supportive  Summary:Occupational Therapy group  Therapist Response:Patient engaged in group. See OT note.       Session Time: 12:00 -1:00  Participation Level:Active  Behavioral Response:CasualAlertDepressed  Type of Therapy:Group therapy  Treatment Goals addressed: Coping  Interventions:CBT; Solution focused; Supportive; Reframing  Summary:12:00 - 12:50: Cln introduced CBT and the way in which it can provide context for addressing stumbling blocks. Group discussed "the problem is not the problem, the problem is how we're thinking about the problem" and tried to change perspective on current struggles.  12:50 -1:00 Clinician led check-out. Clinician assessed for immediate needs, medication compliance and efficacy, and safety concerns  Therapist Response:12:00 - 12:50: Pt engaged in discussion and is able to attempt reframing using CBT.  12:50 - 1:00: At check-out, patientrates hermood at [facility] a scale of 1-10 with 10 being great.Pt  reports afternoon plans of napping. Pt demonstrates some progress as evidenced by continued management  of self-harm thoughts. Patient denies SI/HI at the end of group.    Suicidal/Homicidal: Nowithout intent/plan  Plan: Pt will continue in PHP while working to decrease depression symptoms, decrease SI, and increase ability to manage symptoms in a healthy manner.   Diagnosis: Severe episode of recurrent major depressive disorder, without psychotic features (HCC) [F33.2]    1. Severe episode of recurrent major depressive disorder, without psychotic features (HCC)   2. Difficulty coping       Donia Guiles, LCSW 07/25/2020

## 2020-07-26 ENCOUNTER — Encounter (HOSPITAL_COMMUNITY): Payer: Self-pay

## 2020-07-26 ENCOUNTER — Other Ambulatory Visit (HOSPITAL_COMMUNITY): Payer: 59 | Admitting: Psychiatry

## 2020-07-26 ENCOUNTER — Other Ambulatory Visit: Payer: Self-pay

## 2020-07-26 DIAGNOSIS — R45851 Suicidal ideations: Secondary | ICD-10-CM | POA: Diagnosis not present

## 2020-07-26 DIAGNOSIS — F332 Major depressive disorder, recurrent severe without psychotic features: Secondary | ICD-10-CM

## 2020-07-26 NOTE — Progress Notes (Signed)
Virtual Visit via Video Note  I connected with [name] on 07/26/20 at  9:00 AM EDT by a video enabled telemedicine application and verified that I am speaking with the correct person using two identifiers.  At orientation to the IOP program, Case Manager discussed the limitations of evaluation and management by telemedicine and the availability of in person appointments. The patient expressed understanding and agreed to proceed with virtual visits throughout the duration of the program.   Location:  Patient: Patient Home Provider: Home Office   History of Present Illness: MDD  Observations/Objective: Check In: Case Manager checked in with all participants to review discharge dates, insurance authorizations, work-related documents and needs from the treatment team regarding medications. Client stated needs and engaged in discussion.   Initial Therapeutic Activity: Counselor introduced our guest speaker, Peggye Fothergill, [facility] Pharmacist, who shared about psychiatric medications, side effects, treatment considerations and how to communicate with medical professionals. Group Members asked questions and shared medication concerns. Counselor prompted group members to reference a worksheet called, "Body Scan" to jot down questions and concerns about their physical health in preparation for their upcoming appointments with medical professionals. Client noted preparations for starting inpatient services as primary focus at this time. Counselor encouraged routine medical check-ups, preparing for appointments, following up with recommendations and seeking specialist if needed.    Client became very drowsy and tired, struggling to stay awake to engage in group. Counselor dismissed from completing session, promoting resting and relaxation. Client denied any safety concerns.  Assessment and Plan: Clinician recommends that Client remain in IOP treatment to better manage mental health symptoms,  stabilization and to address treatment plan goals. Clinician recommends adherence to crisis/safety plan, taking medications as prescribed, and following up with medical professionals if any issues arise.   Follow Up Instructions: Clinician will send Webex link for next session. The Client was advised to call back or seek an in-person evaluation if the symptoms worsen or if the condition fails to improve as anticipated.     I provided 90 minutes of non-face-to-face time during this encounter.     Hilbert Odor, LCSW

## 2020-07-27 ENCOUNTER — Other Ambulatory Visit: Payer: Self-pay

## 2020-07-27 ENCOUNTER — Other Ambulatory Visit (HOSPITAL_COMMUNITY): Payer: 59

## 2020-07-28 ENCOUNTER — Other Ambulatory Visit (HOSPITAL_BASED_OUTPATIENT_CLINIC_OR_DEPARTMENT_OTHER): Payer: 59 | Admitting: Psychiatry

## 2020-07-28 ENCOUNTER — Encounter (HOSPITAL_COMMUNITY): Payer: Self-pay | Admitting: Family

## 2020-07-28 ENCOUNTER — Other Ambulatory Visit: Payer: Self-pay

## 2020-07-28 DIAGNOSIS — F332 Major depressive disorder, recurrent severe without psychotic features: Secondary | ICD-10-CM

## 2020-07-28 DIAGNOSIS — R4589 Other symptoms and signs involving emotional state: Secondary | ICD-10-CM

## 2020-07-28 DIAGNOSIS — R45851 Suicidal ideations: Secondary | ICD-10-CM | POA: Diagnosis not present
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
## 2020-07-28 NOTE — Progress Notes (Signed)
Virtual Visit via Video Note  I connected with Erika Durham on @TODAY @ at  9:00 AM EDT by a video enabled telemedicine application and verified that I am speaking with the correct person using two identifiers.  Location: Patient: at home Provider: at home office   I discussed the limitations of evaluation and management by telemedicine and the availability of in person appointments. The patient expressed understanding and agreed to proceed.  I discussed the assessment and treatment plan with the patient. The patient was provided an opportunity to ask questions and all were answered. The patient agreed with the plan and demonstrated an understanding of the instructions.   The patient was advised to call back or seek an in-person evaluation if the symptoms worsen or if the condition fails to improve as anticipated.  I provided 20 minutes of non-face-to-face time during this encounter.   , CNA   Patient ID: Erika Durham, female   DOB: 11-Aug-1995, 25 y.o.   MRN: [id number] As per previous CCA:Pt presents as referral from [facility] due to increased depression and SI with plan. Pt states she has not been attending her college classes, has decline in ADLs, has relapsed with cutting self-harm, and is experiencing SI with no current intent. Pt reports history of depression, anxiety, ADHD, PTSD, and anorexia. Pt states one inpatient admission at age 25 and last year she completed IOP/PHP at [facility]. Pt states she has a therapist, [facility], who she sees 2x/week, however has not been on medication for the past year until [facility] started her on Effexor and Trazadone approximately 1 week ago. Pt states she is tolerating the medication well. Pt reports she smokes marijuana occassionally with last use 3 weeks ago. Pt denies AVH.  Current Symptoms/Problems:Pt reports depressed mood, tearfulness, decreased motivation, low energy, hopelessness, recurrence of self-harm thoughts,  passive SI, increased anxiety, poor sleep, decreased appetite.  Pt transitioned from MH-IOP.  According to [facility], NP note; pt attended and participated in [facility].  Discontinued Effexor and started on Lexapro.  Denies SI/HI or A/V hallucinations. Pt voiced that her dropping out of school and finances are her main stressors at this time.  Pt completed all scheduled days except for three days in virtual MH-IOP.  Pt reports that she is better than when she started in PHP; but states she is still struggling and is awaiting to go to residential program at [facility].  States she will be admitted in 2-3 weeks.  Denies SI/HI or A/V hallucinations.  On a scale of 1-10 (10 being the worst); pt rates her depression at a 6 and her anxiety at a 7.   A:  D/C pt today.  Transition to [facility] in 2-3 weeks according to pt.  Encourage support groups or [facility] before she leaves for [facility]. R:  Pt receptive.  [facility], M.Ed,CNA

## 2020-07-28 NOTE — Patient Instructions (Signed)
D:  Pt completed MH-IOP today.  A:  Discharge today.  F/U with residential treatment at [facility] in 2-3 weeks.  Encourage support groups or [facility] through [facility] before leaving.  R:  Pt receptive.

## 2020-07-31 ENCOUNTER — Other Ambulatory Visit: Payer: Self-pay

## 2020-07-31 ENCOUNTER — Other Ambulatory Visit (HOSPITAL_COMMUNITY): Payer: 59

## 2020-07-31 ENCOUNTER — Encounter (HOSPITAL_COMMUNITY): Payer: Self-pay | Admitting: Family

## 2020-07-31 NOTE — Progress Notes (Signed)
Virtual Visit via Video Note  I connected with Erika Durham on 07/31/20 at  9:00 AM EDT by a video enabled telemedicine application and verified that I am speaking with the correct person using two identifiers.  At orientation to the IOP program, Case Manager discussed the limitations of evaluation and management by telemedicine and the availability of in person appointments. The patient expressed understanding and agreed to proceed with virtual visits throughout the duration of the program.   Location:  Patient: Patient Home Provider: Home Office   History of Present Illness: MDD  Observations/Objective: Check In: Case Manager checked in with all participants to review discharge dates, insurance authorizations, work-related documents and needs from the treatment team regarding medications. Client stated needs and engaged in discussion.   Initial Therapeutic Activity: Occupational Therapist Donne Hazel presented information on communication skill building. Presenter taught the skill of using and applying "I" statements in when addressing issues in relationships. Group members engaged in discussion and participated in activities, reporting out their work. Client intends to utilize skill in future conversations to express perspective an needs.   Second Therapeutic Activity: Counselor facilitated therapeutic processing with group members to assess mood and current functioning, prompting group members to share about application of skills, progress and challenges in treatment/personal lives. Client shared positive outcomes from skills application and shared about a challenge, receiving feedback from group members on strategies to address. Client presents with moderate depression and mild anxiety. Client denied any current SI/HI/psychosis.   Check Out: Counselor closed program by allowing time to celebrate the Client as a graduating group member. Counselor shared reflections on progress and  allow space for group members to share well wishes and appreciates to the graduating client. Counselor prompted graduating client to share takeaways, reflect on progress and final thoughts for the group. Client noted growth in stability, skill building, coping strategies and foundation for transitioning to residential care. Client endorsed safety plan to be followed to prevent safety issues.   Assessment and Plan: Clinician recommends that Client step down from IOP treatment to individual therapy and medication management. Client is recommended for residential care once accepted in program and to utilize support groups in the community.  Clinician recommends adherence to crisis/safety plan, taking medications as prescribed, and following up with medical professionals if any issues arise.   Follow Up Instructions: Clinician will send Webex link for next session. The Client was advised to call back or seek an in-person evaluation if the symptoms worsen or if the condition fails to improve as anticipated.     I provided 180 minutes of non-face-to-face time during this encounter.     Hilbert Odor, LCSW

## 2020-08-01 ENCOUNTER — Ambulatory Visit (HOSPITAL_COMMUNITY): Payer: 59

## 2020-08-02 NOTE — Psych (Signed)
Virtual Visit via Video Note  I connected with Erika Durham on 06/30/20 at  9:00 AM EST by a video enabled telemedicine application and verified that I am speaking with the correct person using two identifiers.  Location: Patient: patient home Provider: Clinical home office   I discussed the limitations of evaluation and management by telemedicine and the availability of in person appointments. The patient expressed understanding and agreed to proceed.  I discussed the assessment and treatment plan with the patient. The patient was provided an opportunity to ask questions and all were answered. The patient agreed with the plan and demonstrated an understanding of the instructions.   The patient was advised to call back or seek an in-person evaluation if the symptoms worsen or if the condition fails to improve as anticipated.  Pt was provided 240 minutes of non-face-to-face time during this encounter.   Donia Guiles, LCSW    [facility] PHP THERAPIST PROGRESS NOTE  Erika Durham [id number]  Session Time: 9:00 - 10:00  Participation Level: Active  Behavioral Response: CasualAlertDepressed  Type of Therapy: Group Therapy  Treatment Goals addressed: Coping  Interventions: CBT, DBT, Supportive and Reframing  Summary: Clinician led check-in regarding current stressors and situation.Clinician utilized active listening and empathetic response and validated patient emotions. Clinician facilitated processing group on pertinent issues.   Therapist Response:  Erika Durham is a 25 y.o. female who presents with depression symptoms.  Patient arrived within time allowed and reports that she is feeling "tired." Patient rates hermood at a3on a scale of 1-10 with 10 being great. Ptstates she has been out of group the past 2 days due to having internet connection issues that have finally resolved. Pt reports yesterday was her partner's birthday and they spent time together. Pt states  her mood remains low an she continues to struggle with sleep. Pt reports she is pursuing residential and is on a wait list for [facility].  Pt able to process. Pt engaged in discussion.      Session Time: 10:00 - 11:00   Participation Level:Active  Behavioral Response:CasualAlertDepressed  Type of Therapy:GroupTherapy  Treatment Goals addressed: Coping  Interventions:CBT, DBT, Supportive and Reframing  Summary:Cln led discussion on control and the way it impacts our lives. Group members shared struggles and worked to identify the way in which control is contributing to the struggle. Cln utilized CBT thought challenging and the Catch-Challenge-Change model to address their control issues.   Therapist Response:Pt engaged in discussion and is able to determine ways in which control is an issue for her and brainstormed how to address it in a healthy manner.      Session Time: 11:00- 12:00  Participation Level:Active  Behavioral Response:CasualAlertDepressed  Type of Therapy: Group Therapy, OT  Treatment Goals addressed: Coping  Interventions:Psychosocial skills training, Supportive  Summary:Occupational Therapy group  Therapist Response:Patient engaged in group. See OT note.       Session Time: 12:00 -1:00  Participation Level:Active  Behavioral Response:CasualAlertDepressed  Type of Therapy:Group therapy  Treatment Goals addressed: Coping  Interventions:CBT; Solution focused; Supportive; Reframing  Summary:12:00 - 12:50: Cln continued topic of CBT cognitive distortions and introduced thought challenging as a way to  utilize the "challenge" C in C-C-C. Group utilized Administrator, Civil [employer] questions" as a way to introduce challenges and reframe distorted thinking. Group members worked through pt examples to practice challenging distorted thinking.  12:50 -1:00 Clinician led check-out. Clinician assessed for immediate  needs, medication compliance and efficacy, and safety concerns  Therapist Response:12:00 - 12:50:  Pt engaged in discussion and demonstrates understanding of challenging distorted thoughts through practice.  12:50 - 1:00: At check-out, patientrates hermood at [facility] a scale of 1-10 with 10 being great.Pt reports afternoon plans of napping and going to an appt. Pt demonstrates some progress as evidenced by increased steps forward. Patient denies SI/HI at the end of group.    Suicidal/Homicidal: Nowithout intent/plan  Plan: Pt will continue in PHP while working to decrease depression symptoms, decrease SI, and increase ability to manage symptoms in a healthy manner.   Diagnosis: Severe episode of recurrent major depressive disorder, without psychotic features (HCC) [F33.2]    1. Severe episode of recurrent major depressive disorder, without psychotic features (HCC)       Donia Guiles, LCSW 08/02/2020

## 2020-08-08 NOTE — Psych (Signed)
Virtual Visit via Video Note  I connected with Erika Durham on 07/14/20 at  9:00 AM EST by a video enabled telemedicine application and verified that I am speaking with the correct person using two identifiers.  Location: Patient: patient home Provider: Clinical home office   I discussed the limitations of evaluation and management by telemedicine and the availability of in person appointments. The patient expressed understanding and agreed to proceed.  I discussed the assessment and treatment plan with the patient. The patient was provided an opportunity to ask questions and all were answered. The patient agreed with the plan and demonstrated an understanding of the instructions.   The patient was advised to call back or seek an in-person evaluation if the symptoms worsen or if the condition fails to improve as anticipated.  Pt was provided 60 minutes of non-face-to-face time during this encounter.   Donia Guiles, LCSW    [facility] PHP THERAPIST PROGRESS NOTE  Erika Durham [id number]  Session Time: 9:00 - 10:00  Participation Level: Active  Behavioral Response: CasualAlertDepressed  Type of Therapy: Group Therapy  Treatment Goals addressed: Coping  Interventions: CBT, DBT, Supportive and Reframing  Summary: Clinician led check-in regarding current stressors and situation.Clinician utilized active listening and empathetic response and validated patient emotions. Clinician facilitated processing group on pertinent issues.   Therapist Response:  Erika Durham is a 25 y.o. female who presents with depression symptoms.  Patient arrived within time allowed and reports that she is feeling "exhausted." Patient rates hermood at a3on a scale of 1-10 with 10 being great. Ptreports she began a new job and is working 5pm - 2am. Pt states she has been doing "okay" mood-wise however is struggling with some anxiety at work. Pt states she has not had SI/self harm thoughts for  the past few days. Pt able to process. Pt engaged in discussion.  Pt chooses to leave group at 10am stating she is too exhausted to focus and needs to take a nap.      Suicidal/Homicidal: Nowithout intent/plan  Plan: Pt will discharge from PHP due to meeting treatment goals of decreased depression symptoms, decreased SI, and increased ability to manage symptoms in a healthy manner. Pt will step-down to IOP within this agency beginning 3/14. Pt and provider are aligned with discharge plan. Pt denies SI/HI at time of discharge.   Diagnosis: Severe episode of recurrent major depressive disorder, without psychotic features (HCC) [F33.2]    1. Severe episode of recurrent major depressive disorder, without psychotic features (HCC)       Donia Guiles, LCSW 08/08/2020

## 2020-08-14 DIAGNOSIS — F322 Major depressive disorder, single episode, severe without psychotic features: Secondary | ICD-10-CM | POA: Insufficient documentation

## 2020-08-14 NOTE — Psych (Signed)
Virtual Visit via Video Note  I connected with Erika Durham on 07/03/20 at  9:00 AM EST by a video enabled telemedicine application and verified that I am speaking with the correct person using two identifiers.  Location: Patient: patient home Provider: Clinical home office   I discussed the limitations of evaluation and management by telemedicine and the availability of in person appointments. The patient expressed understanding and agreed to proceed.  I discussed the assessment and treatment plan with the patient. The patient was provided an opportunity to ask questions and all were answered. The patient agreed with the plan and demonstrated an understanding of the instructions.   The patient was advised to call back or seek an in-person evaluation if the symptoms worsen or if the condition fails to improve as anticipated.  Pt was provided 240 minutes of non-face-to-face time during this encounter.   Donia Guiles, LCSW    [facility] PHP THERAPIST PROGRESS NOTE  Erika Durham [id number]  Session Time: 9:00 - 10:00  Participation Level: Active  Behavioral Response: CasualAlertDepressed  Type of Therapy: Group Therapy  Treatment Goals addressed: Coping  Interventions: CBT, DBT, Supportive and Reframing  Summary: Clinician led check-in regarding current stressors and situation.Clinician utilized active listening and empathetic response and validated patient emotions. Clinician facilitated processing group on pertinent issues.   Therapist Response:  Erika Durham is a 25 y.o. female who presents with depression symptoms.  Patient arrived within time allowed and reports that she is feeling "worn out." Patient rates hermood at a3.5on a scale of 1-10 with 10 being great. Ptreports she spent the weekend with her mom and sister and being with her mom was triggering. Pt states intermittent SI and self harm thoughts throughout the weekend. Pt reports increased drinking  behaviors to cope. Pt reports struggling with boundaries. Pt able to process. Pt engaged in discussion.      Session Time: 10:00 - 11:00   Participation Level:Active  Behavioral Response:CasualAlertDepressed  Type of Therapy:GroupTherapy  Treatment Goals addressed: Coping  Interventions:CBT, DBT, Supportive and Reframing  Summary:Cln led processing group for pt's current struggles. Group members shared stressors and provided support and feedback. Cln brought in topics of boundaries, healthy relationships, and unhealthy thought processes to inform discussion.   Therapist Response: Pt able to process and provide support to group.     Session Time: 11:00- 12:00  Participation Level:Active  Behavioral Response:CasualAlertDepressed  Type of Therapy: Group Therapy, OT  Treatment Goals addressed: Coping  Interventions:Psychosocial skills training, Supportive  Summary:Occupational Therapy group  Therapist Response:Patient engaged in group. See OT note.       Session Time: 12:00 -1:00  Participation Level:Active  Behavioral Response:CasualAlertDepressed  Type of Therapy:Group therapy  Treatment Goals addressed: Coping  Interventions:CBT; Solution focused; Supportive; Reframing  Summary:12:00 - 12:50: Cln introduced topic of DBT distress tolerance skills. Cln provided context for distress tolerance skills and how to practice them. Cln introduced the ACCEPTS distraction skills and group discusses ways to utilize "A" activities.  12:50 -1:00 Clinician led check-out. Clinician assessed for immediate needs, medication compliance and efficacy, and safety concerns  Therapist Response:12:00 - 12:50: Pt engaged in discussion and states they can watch tv and spend time with their pets to practice the "A" skill. 12:50 - 1:00: At check-out, patientrates hermood at aon a scale of 1-10 with 10 being great.Pt reports afternoon  plans of napping and going to an appt. Pt demonstrates some progress as evidenced by increased steps forward. Patient denies SI/HI at the end of  group.    Suicidal/Homicidal: Nowithout intent/plan  Plan: Pt will continue in PHP while working to decrease depression symptoms, decrease SI, and increase ability to manage symptoms in a healthy manner.   Diagnosis: Severe episode of recurrent major depressive disorder, without psychotic features (HCC) [F33.2]    1. Severe episode of recurrent major depressive disorder, without psychotic features (HCC)       Donia Guiles, LCSW 08/14/2020

## 2020-08-16 NOTE — Psych (Signed)
Virtual Visit via Video Note  I connected with Erika Durham on 07/04/20 at  9:00 AM EST by a video enabled telemedicine application and verified that I am speaking with the correct person using two identifiers.  Location: Patient: patient home Provider: Clinical home office   I discussed the limitations of evaluation and management by telemedicine and the availability of in person appointments. The patient expressed understanding and agreed to proceed.  I discussed the assessment and treatment plan with the patient. The patient was provided an opportunity to ask questions and all were answered. The patient agreed with the plan and demonstrated an understanding of the instructions.   The patient was advised to call back or seek an in-person evaluation if the symptoms worsen or if the condition fails to improve as anticipated.  Pt was provided 240 minutes of non-face-to-face time during this encounter.   Donia Guiles, LCSW    [facility] PHP THERAPIST PROGRESS NOTE  Erika Durham [id number]  Session Time: 9:00 - 10:00  Participation Level: Active  Behavioral Response: CasualAlertDepressed  Type of Therapy: Group Therapy  Treatment Goals addressed: Coping  Interventions: CBT, DBT, Supportive and Reframing  Summary: Clinician led check-in regarding current stressors and situation.Clinician utilized active listening and empathetic response and validated patient emotions. Clinician facilitated processing group on pertinent issues.   Therapist Response:  Erika Durham is a 25 y.o. female who presents with depression symptoms.  Patient arrived within time allowed and reports that she is feeling "not great." Patient rates hermood at [facility] a scale of 1-10 with 10 being great. Ptreports yesterday evening was bad and she had a panic attack at the grocery store. Pt states poor sleep. Pt able to process. Pt engaged in discussion.      Session Time: 10:00 - 11:00    Participation Level:Active  Behavioral Response:CasualAlertDepressed  Type of Therapy:GroupTherapy  Treatment Goals addressed: Coping  Interventions:CBT, DBT, Supportive and Reframing  Summary:Cln led discussion on trust and how to establish healthy trust. Group discussed common missteps such as disclosing personal information too soon, ignoring behaviors being displayed, inacurately defining the relationship, and not giving people credit. Cln utilized CBT thought challenging principles to encourage pt's to rely on "evidence" versus feelings. Group members shared struggles they experience with trust.   Therapist Response: Pt engaged in discussion and is able to identify  areas of improvement in their trust process.      Session Time: 11:00- 12:00  Participation Level:Active  Behavioral Response:CasualAlertDepressed  Type of Therapy: Group Therapy, OT  Treatment Goals addressed: Coping  Interventions:Psychosocial skills training, Supportive  Summary:Occupational Therapy group  Therapist Response:Patient engaged in group. See OT note.       Session Time: 12:00 -1:00  Participation Level:Active  Behavioral Response:CasualAlertDepressed  Type of Therapy:Group therapy  Treatment Goals addressed: Coping  Interventions:CBT; Solution focused; Supportive; Reframing  Summary:12:00 - 12:50: Cln continued topic of DBT distress tolerance skills and the ACCEPTS distraction skill. Group reviewed C-C-E skills and discussed how they can practice them in their every day life.  12:50 -1:00 Clinician led check-out. Clinician assessed for immediate needs, medication compliance and efficacy, and safety concerns  Therapist Response:12:00 - 12:50: Pt engaged in discussion and reports she is most likely to practice the "E" skill.  12:50 - 1:00: At check-out, patientrates hermood at [facility] a scale of 1-10 with 10 being great.Pt reports  afternoon plans of napping and knitting. Pt demonstrates some progress as evidenced by managing panic. Patient denies SI/HI at the end of  group.    Suicidal/Homicidal: Nowithout intent/plan  Plan: Pt will continue in PHP while working to decrease depression symptoms, decrease SI, and increase ability to manage symptoms in a healthy manner.   Diagnosis: Severe episode of recurrent major depressive disorder, without psychotic features (HCC) [F33.2]    1. Severe episode of recurrent major depressive disorder, without psychotic features (HCC)       Donia Guiles, LCSW 08/16/2020

## 2021-02-07 ENCOUNTER — Other Ambulatory Visit: Payer: Self-pay

## 2021-02-07 ENCOUNTER — Emergency Department (HOSPITAL_COMMUNITY)
Admission: EM | Admit: 2021-02-07 | Discharge: 2021-02-07 | Disposition: A | Discharge status: Home / Self Care | Payer: 59 | Attending: Emergency Medicine | Admitting: Emergency Medicine

## 2021-02-07 ENCOUNTER — Emergency Department (HOSPITAL_COMMUNITY): Payer: 59

## 2021-02-07 ENCOUNTER — Encounter (HOSPITAL_COMMUNITY): Payer: Self-pay | Admitting: *Deleted

## 2021-02-07 DIAGNOSIS — W208XXA Other cause of strike by thrown, projected or falling object, initial encounter: Secondary | ICD-10-CM | POA: Insufficient documentation

## 2021-02-07 DIAGNOSIS — S0083XA Contusion of other part of head, initial encounter: Secondary | ICD-10-CM | POA: Insufficient documentation

## 2021-02-07 DIAGNOSIS — S060X1A Concussion with loss of consciousness of 30 minutes or less, initial encounter: Secondary | ICD-10-CM | POA: Diagnosis not present

## 2021-02-07 DIAGNOSIS — S0990XA Unspecified injury of head, initial encounter: Secondary | ICD-10-CM | POA: Diagnosis present

## 2021-02-07 MED ORDER — ACETAMINOPHEN 500 MG PO TABS
1000.0000 mg | ORAL_TABLET | Freq: Once | ORAL | Status: AC
  Administered 2021-02-07: 1000 mg via ORAL
  Filled 2021-02-07: qty 2

## 2021-02-07 NOTE — ED Triage Notes (Signed)
The pty was struck in her head yesterday when some tools fell out of a closet  she reports that she l;ost conscious for a few seconds hx of a concussion  headache now  lmp yesterday

## 2021-02-07 NOTE — Discharge Instructions (Signed)
You came to the emergency department today to be evaluated for your symptoms after suffering a head injury.  Your physical exam was reassuring.  The CT scan of your head showed no acute abnormalities.  Based on your history of symptoms it is likely that you have had a concussion.  Please follow-up with your primary care provider for repeat evaluation.  Get help right away if: You have new or worsening physical symptoms, such as: A severe or worsening headache. Weakness or numbness in any part of your body, slurred speech, vision changes, or confusion. Your coordination gets worse. Vomiting repeatedly. You have a seizure. You have unusual behavior changes. You lose consciousness, are sleepier than normal, or are difficult to wake up.

## 2021-02-07 NOTE — ED Notes (Signed)
Pt verbalizes understanding of discharge instructions. Opportunity for questions and answers were provided. Pt discharged from the ED.   ?

## 2021-02-07 NOTE — ED Provider Notes (Signed)
[facility] EMERGENCY DEPARTMENT Provider Note   CSN: [id number] Arrival date & time: 02/07/21  1545     History Chief Complaint  Patient presents with   Headache    Erika Durham is a 25 y.o. female with a history of anxiety, depression, migraine and panic attacks.  Patient presents emergency department with a chief complaint of head injury.  Patient reports that yesterday morning approximately 0900/1000.  Patient reports that she was seen in the closet range off of a shelf and dropped approximately 2 to 3 feet onto her top of her head.  Patient thinks that she had loss of consciousness for "a few seconds."  Patient reports that throughout the rest of the day and this morning she has had a "kind of dissociated feeling."  Patient reports that she has been forgetting some details.    Patient endorses a headache.  States that headache started gradually after her head injury yesterday.  Pain is located throughout her entire head.  Rates pain 3/10 on the pain scale.  Pain is worse with bright lights.  No other aggravating factors.  Patient has had relief of symptoms with Tylenol.  Patient complains of pain to crown of head.  Patient rates pain 5/10 on the pain scale.  Pain is worse with touch.    Headache Associated symptoms: no abdominal pain, no back pain, no dizziness, no fever, no nausea, no neck pain, no numbness, no seizures, no vomiting and no weakness       Past Medical History:  Diagnosis Date   Anxiety    Depression    Migraine    Panic attack     There are no problems to display for this patient.   Past Surgical History:  Procedure Laterality Date   WISDOM TOOTH EXTRACTION       OB History   No obstetric history on file.     Family History  Problem Relation Age of Onset   Healthy Mother    Anxiety disorder Mother    Healthy Father    Depression Sister    Anxiety disorder Sister     Social History   Tobacco Use   Smoking status:  Never   Smokeless tobacco: Never  Vaping Use   Vaping Use: Never used  Substance Use Topics   Alcohol use: Yes    Comment: socially   Drug use: Yes    Types: Marijuana    Comment: socially    Home Medications Prior to Admission medications   Medication Sig Start Date End Date Taking? Authorizing Provider  clonazePAM (KLONOPIN) 0.5 MG tablet Take 0.25 mg by mouth daily as needed for anxiety.    [provider]  escitalopram (LEXAPRO) 5 MG tablet Take 1 tablet (5 mg total) by mouth daily. 07/09/20 07/09/21  [name], NP  traZODone (DESYREL) 50 MG tablet Take 1 tablet (50 mg total) by mouth at bedtime as needed for sleep. 06/14/20   Money, Gerlene Burdock, FNP    Allergies    Rizatriptan  Review of Systems   Review of Systems  Constitutional:  Negative for chills and fever.  HENT:  Negative for facial swelling.   Eyes:  Negative for visual disturbance.  Respiratory:  Negative for shortness of breath.   Cardiovascular:  Negative for chest pain.  Gastrointestinal:  Negative for abdominal pain, nausea and vomiting.  Genitourinary:  Negative for difficulty urinating and enuresis.  Musculoskeletal:  Negative for back pain and neck pain.  Skin:  Negative for color change and rash.  Neurological:  Positive for headaches. Negative for dizziness, tremors, seizures, syncope, facial asymmetry, speech difficulty, weakness, light-headedness and numbness.  Psychiatric/Behavioral:  Negative for confusion.    Physical Exam Updated Vital Signs BP (!) 121/92   Pulse 93   Temp 98.4 F (36.9 C)   Resp 18   Ht 5\' 1"  (1.549 m)   Wt 79.4 kg   LMP 02/06/2021   SpO2 100%   BMI 33.07 kg/m   Physical Exam Vitals and nursing note reviewed.  Constitutional:      General: She is not in acute distress.    Appearance: She is not ill-appearing, toxic-appearing or diaphoretic.  HENT:     Head: Normocephalic. Contusion present. No raccoon eyes, Battle's sign, abrasion, masses, right periorbital  erythema, left periorbital erythema or laceration.     Jaw: No trismus or pain on movement.      Mouth/Throat:     Mouth: Mucous membranes are moist. No injury or lacerations.     Pharynx: Oropharynx is clear. Uvula midline. No pharyngeal swelling, oropharyngeal exudate, posterior oropharyngeal erythema or uvula swelling.  Eyes:     General: No scleral icterus.       Right eye: No discharge.        Left eye: No discharge.     Extraocular Movements: Extraocular movements intact.     Conjunctiva/sclera: Conjunctivae normal.     Pupils: Pupils are equal, round, and reactive to light.  Cardiovascular:     Rate and Rhythm: Normal rate.  Pulmonary:     Effort: Pulmonary effort is normal.  Abdominal:     Palpations: Abdomen is soft.     Tenderness: There is no abdominal tenderness.  Musculoskeletal:     Cervical back: Normal range of motion and neck supple. No edema, erythema, signs of trauma, rigidity, torticollis or crepitus. No pain with movement, spinous process tenderness or muscular tenderness. Normal range of motion.     Comments: No midline tenderness or deformity to cervical, thoracic, lumbar spine.  Skin:    General: Skin is warm and dry.  Neurological:     General: No focal deficit present.     Mental Status: She is alert.     GCS: GCS eye subscore is 4. GCS verbal subscore is 5. GCS motor subscore is 6.     Cranial Nerves: No cranial nerve deficit or facial asymmetry.     Sensory: Sensation is intact.     Motor: No weakness, tremor, seizure activity or pronator drift.     Coordination: Romberg sign negative. Finger-Nose-Finger Test normal.     Gait: Gait is intact. Gait normal.     Comments: CN II-XII intact, equal grip strength, +5 strength to bilateral upper and lower extremities, sensation to light touch intact to bilateral upper and lower extremities.  Patient able to stand and ambulate without difficulty.  Psychiatric:        Behavior: Behavior is cooperative.    ED  Results / Procedures / Treatments   Labs (all labs ordered are listed, but only abnormal results are displayed) Labs Reviewed - No data to display  EKG None  Radiology CT HEAD WO CONTRAST (04/08/2021)  Result Date: 02/07/2021 CLINICAL DATA:  Headache, posttraumatic EXAM: CT HEAD WITHOUT CONTRAST TECHNIQUE: Contiguous axial images were obtained from the base of the skull through the vertex without intravenous contrast. COMPARISON:  None. FINDINGS: Brain: No evidence of acute infarction, hemorrhage, cerebral edema, mass, mass effect, or midline shift.  Ventricles and sulci are within normal limits for age. No extra-axial fluid collection. Vascular: No hyperdense vessel or unexpected calcification. Skull: Normal. Negative for fracture or focal lesion. Sinuses/Orbits: Mucous retention cysts in the left maxillary sinus. The orbits are unremarkable. Other: The mastoid air cells are well aerated. IMPRESSION: No acute intracranial process. Electronically Signed   By: Wiliam Ke M.D.   On: 02/07/2021 17:38    Procedures Procedures   Medications Ordered in ED Medications  acetaminophen (TYLENOL) tablet 1,000 mg (1,000 mg Oral Given 02/07/21 1827)    ED Course  I have reviewed the triage vital signs and the nursing notes.  Pertinent labs & imaging results that were available during my care of the patient were reviewed by me and considered in my medical decision making (see chart for details).    MDM Rules/Calculators/A&P                           Alert 25 year old female no acute distress, nontoxic appearing.  Presents emergency department with a negative head injury.  Reports wrench fell to 3 feet landing on her head.  Patient endorses episode of a few seconds.  Patient has had reports of dissociative feeling and trouble remembering details since her head injury.  Patient not on any blood thinners.  Neuro exam is reassuring.  Noncontrast head CT shows no acute intracranial abnormalities.  Suspect that  patient's symptoms are due to a possible concussion.  We will have patient follow-up with primary care provider.  Discussed results, findings, treatment and follow up. Patient advised of return precautions. Patient verbalized understanding and agreed with plan.   Final Clinical Impression(s) / ED Diagnoses Final diagnoses:  Injury of head, initial encounter  Concussion with loss of consciousness of 30 minutes or less, initial encounter    Rx / DC Orders ED Discharge Orders     None        Haskel Schroeder, PA-C 02/07/21 1841    Wynetta Fines, MD 02/07/21 2302

## 2021-02-07 NOTE — ED Provider Notes (Signed)
Emergency Medicine Provider Triage Evaluation Note  Erika Durham , a 25 y.o. female  was evaluated in triage.  Pt complains of headache.  She states that a wrench fell out of a closet yesterday and struck her on the top of her head.  She states that she has difficulty remembering yesterday and that she thinks she lost consciousness for "a few seconds."  She denies any visual cuts, weakness, or coordination difficulty.  No neck pain.  Review of Systems  Positive: Headache, memory difficulties Negative: Fevers  Physical Exam  BP (!) 121/92   Pulse 93   Temp 98.4 F (36.9 C)   Resp 18   Ht 5\' 1"  (1.549 m)   Wt 79.4 kg   LMP 02/06/2021   SpO2 100%   BMI 33.07 kg/m  Gen:   Awake, no distress   Resp:  Normal effort  MSK:   Moves extremities without difficulty  Other:  Small superficial scabs on the vertex of the scalp without bleeding.  Medical Decision Making  Medically screening exam initiated at 4:23 PM.  Appropriate orders placed.  Erika Durham was informed that the remainder of the evaluation will be completed by another provider, this initial triage assessment does not replace that evaluation, and the importance of remaining in the ED until their evaluation is complete.  Note: Portions of this report may have been transcribed using voice recognition software. Every effort was made to ensure accuracy; however, inadvertent computerized transcription errors may be present    Erika Edis, PA-C 02/07/21 1624    04/09/21, MD 02/07/21 2302

## 2021-02-09 ENCOUNTER — Telehealth: Payer: 59 | Admitting: Physician Assistant

## 2021-02-09 DIAGNOSIS — S060XAD Concussion with loss of consciousness status unknown, subsequent encounter: Secondary | ICD-10-CM

## 2021-02-09 NOTE — Patient Instructions (Signed)
Erika Durham, thank you for joining Margaretann Loveless, PA-C for today's virtual visit.  While this provider is not your primary care provider (PCP), if your PCP is located in our provider database this encounter information will be shared with them immediately following your visit.  Consent: (Patient) Erika Durham provided verbal consent for this virtual visit at the beginning of the encounter.  Current Medications:  Current Outpatient Medications:    clonazePAM (KLONOPIN) 0.5 MG tablet, Take 0.25 mg by mouth daily as needed for anxiety., Disp: , Rfl:    escitalopram (LEXAPRO) 5 MG tablet, Take 1 tablet (5 mg total) by mouth daily., Disp: 30 tablet, Rfl: 0   traZODone (DESYREL) 50 MG tablet, Take 1 tablet (50 mg total) by mouth at bedtime as needed for sleep., Disp: 30 tablet, Rfl: 0   Medications ordered in this encounter:  No orders of the defined types were placed in this encounter.    *If you need refills on other medications prior to your next appointment, please contact your pharmacy*  Follow-Up: Call back or seek an in-person evaluation if the symptoms worsen or if the condition fails to improve as anticipated.  Other Instructions Concussion, Adult A concussion is a brain injury from a hard, direct hit (trauma) to your head or body. This direct hit causes your brain to quickly shake back and forth inside your skull. A concussion may also be called a mild traumatic brain injury (TBI). Healing from this injury can take time. What are the causes? This condition is caused by: A direct hit to your head, such as: Running into a player during a game. Being hit in a fight. Hitting your head on a hard surface. A quick and sudden movement of the head or neck, such as in a car crash. What are the signs or symptoms? The signs of a concussion can be hard to notice. They may be missed by you, family members, and doctors. You may look fine on the outside but may not act or feel  normal. Physical symptoms Headaches. Being dizzy. Problems with body balance. Being sensitive to light or noise. Vomiting or feeling like you may vomit. Being tired. Problems seeing or hearing. Not sleeping or eating as you used to. Seizure. Mental and emotional symptoms Feeling grouchy (irritable). Having mood changes. Problems remembering things. Trouble focusing your mind (concentrating), organizing, or making decisions. Being slow to think, act, react, speak, or read. Feeling worried or nervous (anxious). Feeling sad (depressed). How is this treated? This condition may be treated by: Stopping sports or activity if you are injured. If you hit your head or have signs of concussion: Do not return to sports or activities the same day. Get checked by a doctor before you return to your activities. Resting your body and your mind. Being watched carefully, often at home. Medicines to help with symptoms such as: Headaches. Feeling like you may vomit. Problems with sleep. Avoiding alcohol and drugs. Being asked to go to a concussion clinic or a place to help you recover (rehabilitation center). Recovery from a concussion can take time. Return to activities only: When you are fully healed. When your doctor says it is safe. Avoid taking strong pain medicines (opioids) for a concussion. Follow these instructions at home: Activity Limit activities that need a lot of thought or focus, such as: Homework or work for your job. Watching TV. Using the computer or phone. Playing memory games and puzzles. Rest. Rest helps your brain heal. Make sure you:  Get plenty of sleep. Most adults should get 7-9 hours of sleep each night. Rest during the day. Take naps or breaks when you feel tired. Avoid activity like exercise until your doctor says its safe. Stop any activity that makes symptoms worse. Do not do activities that could cause a second concussion, such as riding a bike or playing  sports. Ask your doctor when you can return to your normal activities, such as school, work, sports, and driving. Your ability to react may be slower. Do not do these activities if you are dizzy. General instructions  Take over-the-counter and prescription medicines only as told by your doctor. Do not drink alcohol until your doctor says you can. Watch your symptoms and tell other people to do the same. Other problems can occur after a concussion. Older adults have a higher risk of serious problems. Tell your work Production designer, theatre/television/film, teachers, Tax adviser, school counselor, coach, or Event organiser about your injury and symptoms. Tell them about what you can or cannot do. Keep all follow-up visits as told by your doctor. This is important. How is this prevented? It is very important that you do not get another brain injury. In rare cases, another injury can cause brain damage that will not go away, brain swelling, or death. The risk of this is greatest in the first 7-10 days after a head injury. To avoid injuries: Stop activities that could lead to a second concussion, such as contact sports, until your doctor says it is okay. When you return to sports or activities: Do not crash into other players. This is how most concussions happen. Follow the rules. Respect other players. Do not engage in violent behavior while playing. Get regular exercise. Do strength and balance training. Wear a helmet that fits you well during sports, biking, or other activities. Helmets can help protect you from serious skull and brain injuries, but they do not protect you from a concussion. Even when wearing a helmet, you should avoid being hit in the head. Contact a doctor if: Your symptoms do not get better. You have new symptoms. You have another injury. Get help right away if: You have bad headaches or your headaches get worse. You feel weak or numb in any part of your body. You feel mixed up (confused). Your balance  gets worse. You vomit often. You feel more sleepy than normal. You cannot speak well, or have slurred speech. You have a seizure. Others have trouble waking you up. You have changes in how you act. You have changes in how you see (vision). You pass out (lose consciousness). These symptoms may be an emergency. Do not wait to see if the symptoms will go away. Get medical help right away. Call your local emergency services (911 in the [place] Do not drive yourself to the hospital. Summary A concussion is a brain injury from a hard, direct hit (trauma) to your head or body. This condition is treated with rest and careful watching of symptoms. Ask your doctor when you can return to your normal activities, such as school, work, or driving. Get help right away if you have a very bad headache, feel weak in any part of your body, have a seizure, have changes in how you act or see, or if you are mixed up or more sleepy than normal. This information is not intended to replace advice given to you by your health care provider. Make sure you discuss any questions you have with your health care provider. Document  Revised: 07/06/2020 Document Reviewed: 07/06/2020 Elsevier Patient Education  2022 [employer].    If you have been instructed to have an in-person evaluation today at a local Urgent Care facility, please use the link below. It will take you to a list of all of our available [facility], including address, phone number and hours of operation. Please do not delay care.  [facility]  If you or a family member do not have a primary care provider, use the link below to schedule a visit and establish care. When you choose a [facility] primary care physician or advanced practice provider, you gain a long-term partner in health. Find a Primary Care Provider  Learn more about [facility]'s in-office and virtual care options:  - Get Care Now

## 2021-02-09 NOTE — Progress Notes (Signed)
Virtual Visit Consent   Erika Durham, you are scheduled for a virtual visit with a [facility] provider today.     Just as with appointments in the office, your consent must be obtained to participate.  Your consent will be active for this visit and any virtual visit you may have with one of our providers in the next 365 days.     If you have a MyChart account, a copy of this consent can be sent to you electronically.  All virtual visits are billed to your insurance company just like a traditional visit in the office.    As this is a virtual visit, video technology does not allow for your provider to perform a traditional examination.  This may limit your provider's ability to fully assess your condition.  If your provider identifies any concerns that need to be evaluated in person or the need to arrange testing (such as labs, EKG, etc.), we will make arrangements to do so.     Although advances in technology are sophisticated, we cannot ensure that it will always work on either your end or our end.  If the connection with a video visit is poor, the visit may have to be switched to a telephone visit.  With either a video or telephone visit, we are not always able to ensure that we have a secure connection.     I need to obtain your verbal consent now.   Are you willing to proceed with your visit today?    Erika Durham has provided verbal consent on 02/09/2021 for a virtual visit (video or telephone).   Margaretann Loveless, PA-C   Date: 02/09/2021 2:04 PM   Virtual Visit via Video Note   I, Margaretann Loveless, connected with  Erika Durham  ([id number], 1995/07/11) on 02/09/21 at  2:00 PM EDT by a video-enabled telemedicine application and verified that I am speaking with the correct person using two identifiers.  Location: Patient: Virtual Visit Location Patient: Home Provider: Virtual Visit Location Provider: Home Office   I discussed the limitations of evaluation and  management by telemedicine and the availability of in person appointments. The patient expressed understanding and agreed to proceed.    History of Present Illness: Erika Durham is a 25 y.o. who identifies as a female who was assigned female at birth, and is being seen today for concussion follow up. She was seen at [facility] on 02/07/21, injury occurring on 02/06/21. She reports that she still has intermittent headaches and difficulty focusing. She did try to go to work yesterday, 02/08/21, but was unable to stay. Worsened her headache and made simple errors. Asking for a work note to allow her more time to rest and try to see if concussion symptoms will resolve.    Problems: There are no problems to display for this patient.   Allergies:  Allergies  Allergen Reactions   Rizatriptan Anxiety    Other reaction(s): Other (See Comments) Body wide migraine.  Right side body pain.    Medications:  Current Outpatient Medications:    clonazePAM (KLONOPIN) 0.5 MG tablet, Take 0.25 mg by mouth daily as needed for anxiety., Disp: , Rfl:    escitalopram (LEXAPRO) 5 MG tablet, Take 1 tablet (5 mg total) by mouth daily., Disp: 30 tablet, Rfl: 0   traZODone (DESYREL) 50 MG tablet, Take 1 tablet (50 mg total) by mouth at bedtime as needed for sleep., Disp: 30 tablet, Rfl: 0  Observations/Objective: Patient is well-developed, well-nourished in  no acute distress.  Resting comfortably at home.  Head is normocephalic, atraumatic.  No labored breathing.  Speech is clear and coherent with logical content.  Patient is alert and oriented at baseline.    Assessment and Plan: 1. Concussion with unknown loss of consciousness status, subsequent encounter  - Work note provided - Advised to continue to rest as needed and avoid screen time - Brain exercises, reading, art, light exercise (walking) are all ok to continue to do - Stop any activity if worsening concussion symptoms - Seek in person evaluation if  symptoms worsen or fail to improve.  Follow Up Instructions: I discussed the assessment and treatment plan with the patient. The patient was provided an opportunity to ask questions and all were answered. The patient agreed with the plan and demonstrated an understanding of the instructions.  A copy of instructions were sent to the patient via MyChart unless otherwise noted below.    The patient was advised to call back or seek an in-person evaluation if the symptoms worsen or if the condition fails to improve as anticipated.  Time:  I spent 10 minutes with the patient via telehealth technology discussing the above problems/concerns.    Margaretann Loveless, PA-C

## 2021-04-25 ENCOUNTER — Telehealth: Payer: No Typology Code available for payment source | Admitting: Physician Assistant

## 2021-04-25 DIAGNOSIS — J208 Acute bronchitis due to other specified organisms: Secondary | ICD-10-CM | POA: Diagnosis not present

## 2021-04-25 DIAGNOSIS — B9689 Other specified bacterial agents as the cause of diseases classified elsewhere: Secondary | ICD-10-CM | POA: Diagnosis not present

## 2021-04-25 MED ORDER — BENZONATATE 100 MG PO CAPS: 100.0000 mg | ORAL_CAPSULE | Freq: Three times a day (TID) | ORAL | 0 refills | Status: DC | PRN

## 2021-04-25 MED ORDER — DOXYCYCLINE HYCLATE 100 MG PO TABS: 100.0000 mg | ORAL_TABLET | Freq: Two times a day (BID) | ORAL | 0 refills | Status: DC

## 2021-04-25 MED ORDER — ALBUTEROL SULFATE HFA 108 (90 BASE) MCG/ACT IN AERS: 2.0000 | INHALATION_SPRAY | Freq: Four times a day (QID) | RESPIRATORY_TRACT | 0 refills | Status: DC | PRN

## 2021-04-25 NOTE — Patient Instructions (Signed)
Erika Durham, thank you for joining Piedad Climes, PA-C for today's virtual visit.  While this provider is not your primary care provider (PCP), if your PCP is located in our provider database this encounter information will be shared with them immediately following your visit.  Consent: (Patient) Erika Durham provided verbal consent for this virtual visit at the beginning of the encounter.  Current Medications:  Current Outpatient Medications:    clonazePAM (KLONOPIN) 0.5 MG tablet, Take 0.25 mg by mouth daily as needed for anxiety., Disp: , Rfl:    escitalopram (LEXAPRO) 5 MG tablet, Take 1 tablet (5 mg total) by mouth daily., Disp: 30 tablet, Rfl: 0   traZODone (DESYREL) 50 MG tablet, Take 1 tablet (50 mg total) by mouth at bedtime as needed for sleep., Disp: 30 tablet, Rfl: 0   Medications ordered in this encounter:  No orders of the defined types were placed in this encounter.    *If you need refills on other medications prior to your next appointment, please contact your pharmacy*  Follow-Up: Call back or seek an in-person evaluation if the symptoms worsen or if the condition fails to improve as anticipated.  Other Instructions Take antibiotic (Doxycycline) as directed.  Increase fluids.  Get plenty of rest. Use Mucinex for congestion. Take a daily probiotic (I recommend Align or Culturelle, but even Activia Yogurt may be beneficial).  A humidifier placed in the bedroom may offer some relief for a dry, scratchy throat of nasal irritation.  Read information below on acute bronchitis. Please call or return to clinic if symptoms are not improving.  Acute Bronchitis Bronchitis is when the airways that extend from the windpipe into the lungs get red, puffy, and painful (inflamed). Bronchitis often causes thick spit (mucus) to develop. This leads to a cough. A cough is the most common symptom of bronchitis. In acute bronchitis, the condition usually begins suddenly and goes  away over time (usually in 2 weeks). Smoking, allergies, and asthma can make bronchitis worse. Repeated episodes of bronchitis may cause more lung problems.  HOME CARE Rest. Drink enough fluids to keep your pee (urine) clear or pale yellow (unless you need to limit fluids as told by your doctor). Only take over-the-counter or prescription medicines as told by your doctor. Avoid smoking and secondhand smoke. These can make bronchitis worse. If you are a smoker, think about using nicotine gum or skin patches. Quitting smoking will help your lungs heal faster. Reduce the chance of getting bronchitis again by: Washing your hands often. Avoiding people with cold symptoms. Trying not to touch your hands to your mouth, nose, or eyes. Follow up with your doctor as told.  GET HELP IF: Your symptoms do not improve after 1 week of treatment. Symptoms include: Cough. Fever. Coughing up thick spit. Body aches. Chest congestion. Chills. Shortness of breath. Sore throat.  GET HELP RIGHT AWAY IF:  You have an increased fever. You have chills. You have severe shortness of breath. You have bloody thick spit (sputum). You throw up (vomit) often. You lose too much body fluid (dehydration). You have a severe headache. You faint.  MAKE SURE YOU:  Understand these instructions. Will watch your condition. Will get help right away if you are not doing well or get worse. Document Released: 10/09/2007 Document Revised: 12/23/2012 Document Reviewed: 10/13/2012 [facility] Patient Information 2015 [place], [place]. This information is not intended to replace advice given to you by your health care provider. Make sure you discuss any questions you have  with your health care provider.    If you have been instructed to have an in-person evaluation today at a local Urgent Care facility, please use the link below. It will take you to a list of all of our available [facility], including address,  phone number and hours of operation. Please do not delay care.  [facility]  If you or a family member do not have a primary care provider, use the link below to schedule a visit and establish care. When you choose a [facility] primary care physician or advanced practice provider, you gain a long-term partner in health. Find a Primary Care Provider  Learn more about [facility]'s in-office and virtual care options: [facility] - Get Care Now

## 2021-04-25 NOTE — Progress Notes (Signed)
Virtual Visit Consent   Erika Durham, you are scheduled for a virtual visit with a [facility] provider today.     Just as with appointments in the office, your consent must be obtained to participate.  Your consent will be active for this visit and any virtual visit you may have with one of our providers in the next 365 days.     If you have a MyChart account, a copy of this consent can be sent to you electronically.  All virtual visits are billed to your insurance company just like a traditional visit in the office.    As this is a virtual visit, video technology does not allow for your provider to perform a traditional examination.  This may limit your provider's ability to fully assess your condition.  If your provider identifies any concerns that need to be evaluated in person or the need to arrange testing (such as labs, EKG, etc.), we will make arrangements to do so.     Although advances in technology are sophisticated, we cannot ensure that it will always work on either your end or our end.  If the connection with a video visit is poor, the visit may have to be switched to a telephone visit.  With either a video or telephone visit, we are not always able to ensure that we have a secure connection.     I need to obtain your verbal consent now.   Are you willing to proceed with your visit today?    Erika Durham has provided verbal consent on 04/25/2021 for a virtual visit (video or telephone).   Piedad Climes, [place]   Date: 04/25/2021 12:34 PM   Virtual Visit via Video Note   I, Piedad Climes, connected with  Erika Durham  ([id number], Jul 24, 1995) on 04/25/21 at 12:30 PM EST by a video-enabled telemedicine application and verified that I am speaking with the correct person using two identifiers.  Location: Patient: Virtual Visit Location Patient: Home Provider: Virtual Visit Location Provider: Home Office   I discussed the limitations of evaluation and  management by telemedicine and the availability of in person appointments. The patient expressed understanding and agreed to proceed.    History of Present Illness: Erika Durham is a 25 y.o. who identifies as a female who was assigned female at birth, and is being seen today for URI symptoms starting Friday of last week which have progressively worsening, especially over the past 48 hours with increased cough with productive green phlegm and new onset low-grade fever (Tmax 101) Has been taking OTC Dayquil and Nyquil with only some relief. Denies chest pain. Notes some chest tightness and wheezing, worse with exertion. Denies recent travel or sick contact. At-home COVID test was negative.  HPI: HPI  Problems: There are no problems to display for this patient.   Allergies:  Allergies  Allergen Reactions   Rizatriptan Anxiety    Other reaction(s): Other (See Comments) Body wide migraine.  Right side body pain.    Medications:  Current Outpatient Medications:    clonazePAM (KLONOPIN) 0.5 MG tablet, Take 0.25 mg by mouth daily as needed for anxiety., Disp: , Rfl:    escitalopram (LEXAPRO) 5 MG tablet, Take 1 tablet (5 mg total) by mouth daily., Disp: 30 tablet, Rfl: 0   traZODone (DESYREL) 50 MG tablet, Take 1 tablet (50 mg total) by mouth at bedtime as needed for sleep., Disp: 30 tablet, Rfl: 0  Observations/Objective: Patient is well-developed, well-nourished in no acute  distress.  Resting comfortably at home.  Head is normocephalic, atraumatic.  No labored breathing. Speech is clear and coherent with logical content.  Patient is alert and oriented at baseline.   Assessment and Plan: 1. Acute bacterial bronchitis Rx doxycycline.  Increase fluids.  Rest.  Saline nasal spray.  Probiotic.  Mucinex as directed.  Humidifier in bedroom. Tessalon and Albuterol per orders.  Call or return to clinic if symptoms are not improving.   Follow Up Instructions: I discussed the assessment and  treatment plan with the patient. The patient was provided an opportunity to ask questions and all were answered. The patient agreed with the plan and demonstrated an understanding of the instructions.  A copy of instructions were sent to the patient via MyChart unless otherwise noted below.   The patient was advised to call back or seek an in-person evaluation if the symptoms worsen or if the condition fails to improve as anticipated.  Time:  I spent 10 minutes with the patient via telehealth technology discussing the above problems/concerns.    Piedad Climes, PA-C

## 2021-05-29 ENCOUNTER — Ambulatory Visit (HOSPITAL_BASED_OUTPATIENT_CLINIC_OR_DEPARTMENT_OTHER): Payer: No Typology Code available for payment source | Admitting: Nurse Practitioner

## 2021-06-13 ENCOUNTER — Other Ambulatory Visit: Payer: Self-pay

## 2021-06-13 ENCOUNTER — Ambulatory Visit (INDEPENDENT_AMBULATORY_CARE_PROVIDER_SITE_OTHER): Payer: No Typology Code available for payment source | Admitting: Nurse Practitioner

## 2021-06-13 ENCOUNTER — Encounter (HOSPITAL_BASED_OUTPATIENT_CLINIC_OR_DEPARTMENT_OTHER): Payer: Self-pay | Admitting: Nurse Practitioner

## 2021-06-13 VITALS — BP 117/82 | HR 98 | Ht 61.0 in | Wt 169.3 lb

## 2021-06-13 DIAGNOSIS — F411 Generalized anxiety disorder: Secondary | ICD-10-CM | POA: Diagnosis not present

## 2021-06-13 DIAGNOSIS — M25562 Pain in left knee: Secondary | ICD-10-CM | POA: Diagnosis not present

## 2021-06-13 DIAGNOSIS — Z7689 Persons encountering health services in other specified circumstances: Secondary | ICD-10-CM | POA: Diagnosis not present

## 2021-06-13 DIAGNOSIS — M25551 Pain in right hip: Secondary | ICD-10-CM | POA: Diagnosis not present

## 2021-06-13 DIAGNOSIS — M2480 Other specific joint derangements of unspecified joint, not elsewhere classified: Secondary | ICD-10-CM

## 2021-06-13 DIAGNOSIS — Z23 Encounter for immunization: Secondary | ICD-10-CM

## 2021-06-13 DIAGNOSIS — F322 Major depressive disorder, single episode, severe without psychotic features: Secondary | ICD-10-CM

## 2021-06-13 DIAGNOSIS — M25552 Pain in left hip: Secondary | ICD-10-CM

## 2021-06-13 DIAGNOSIS — G8929 Other chronic pain: Secondary | ICD-10-CM

## 2021-06-13 DIAGNOSIS — F339 Major depressive disorder, recurrent, unspecified: Secondary | ICD-10-CM

## 2021-06-13 MED ORDER — CYCLOBENZAPRINE HCL 5 MG PO TABS: 5.0000 mg | ORAL_TABLET | Freq: Three times a day (TID) | ORAL | 1 refills | Status: DC | PRN

## 2021-06-13 MED ORDER — MELOXICAM 15 MG PO TABS: 15.0000 mg | ORAL_TABLET | Freq: Every day | ORAL | 0 refills | Status: DC

## 2021-06-13 NOTE — Patient Instructions (Addendum)
Thank you for choosing [place] at [facility] for your Primary Care needs. I am excited for the opportunity to partner with you to meet your health care goals. It was a pleasure meeting you today!  Recommendations from today's visit: We are going to get some basic labs today and see if we see anything that could be contributing to the joint pain you are experiencing.  I would try the light to see if that is helpful for the depression symptoms during this time of year.  I will be in touch to let you know if we need to make any changes based on the labs.  Let me know if the medication is helpful.   Information on diet, exercise, and health maintenance recommendations are listed below. This is information to help you be sure you are on track for optimal health and monitoring.   Please look over this and let us know if you have any questions or if you have completed any of the health maintenance outside of [place] so that we can be sure your records are up to date.  ___________________________________________________________ About Me: I am an Adult-Geriatric Nurse Practitioner with a background in caring for patients for more than 20 years with a strong intensive care background. I provide primary care and sports medicine services to patients age 47 and older within this office. My education had a strong focus on caring for the older adult population, which I am passionate about. I am also the director of the APP Fellowship with [facility].   My desire is to provide you with the best service through preventive medicine and supportive care. I consider you a part of the medical team and value your input. I work diligently to ensure that you are heard and your needs are met in a safe and effective manner. I want you to feel comfortable with me as your provider and want you to know that your health concerns are important to me.  For your information, our office hours  are: Monday, Tuesday, and Thursday 8:00 AM - 5:00 PM Wednesday and Friday 8:00 AM - 12:00 PM.   In my time away from the office I am teaching new APP's within the system and am unavailable, but my partner, Dr. Burnard Bunting is in the office for emergent needs.   If you have questions or concerns, please call our office at [phone] or send [place] a MyChart message and we will respond as quickly as possible.  ____________________________________________________________ MyChart:  For all urgent or time sensitive needs we ask that you please call the office to avoid delays. Our number is [phone]. MyChart is not constantly monitored and due to the large volume of messages a day, replies may take up to 72 business hours.  MyChart Policy: MyChart allows for you to see your visit notes, after visit summary, provider recommendations, lab and tests results, make an appointment, request refills, and contact your provider or the office for non-urgent questions or concerns. Providers are seeing patients during normal business hours and do not have built in time to review MyChart messages.  We ask that you allow a minimum of 3 business days for responses to [employer]. For this reason, please do not send urgent requests through [place]. Please call the office at [phone]. New and ongoing conditions may require a visit. We have virtual and in person visit available for your convenience.  Complex MyChart concerns may require a visit. Your provider may request you  schedule a virtual or in person visit to ensure we are providing the best care possible. MyChart messages sent after 11:00 AM on Friday will not be received by the provider until Monday morning.    Lab and Test Results: You will receive your lab and test results on MyChart as soon as they are completed and results have been sent by the lab or testing facility. Due to this service, you will receive your results BEFORE your provider.  I review  lab and tests results each morning prior to seeing patients. Some results require collaboration with other providers to ensure you are receiving the most appropriate care. For this reason, we ask that you please allow a minimum of 3-5 business days from the time the ALL results have been received for your provider to receive and review lab and test results and contact you about these.  Most lab and test result comments from the provider will be sent through [place]. Your provider may recommend changes to the plan of care, follow-up visits, repeat testing, ask questions, or request an office visit to discuss these results. You may reply directly to this message or call the office at [phone] to provide information for the provider or set up an appointment. In some instances, you will be called with test results and recommendations. Please let us know if this is preferred and we will make note of this in your chart to provide this for you.    If you have not heard a response to your lab or test results in 5 business days from all results returning to [place], please call the office to let us know. We ask that you please avoid calling prior to this time unless there is an emergent concern. Due to high call volumes, this can delay the resulting process.  After Hours: For all non-emergency after hours needs, please call the office at [phone] and select the option to reach the on-call provider service. On-call services are shared between multiple [place] offices and therefore it will not be possible to speak directly with your provider. On-call providers may provide medical advice and recommendations, but are unable to provide refills for maintenance medications.  For all emergency or urgent medical needs after normal business hours, we recommend that you seek care at the closest Urgent Care or Emergency Department to ensure appropriate treatment in a timely manner.  [facility] [place]  has a 24 hour emergency room located on the ground floor for your convenience.   Urgent Concerns During the Business Day Providers are seeing patients from 8AM to [place] with a busy schedule and are most often not able to respond to non-urgent calls until the end of the day or the next business day. If you should have URGENT concerns during the day, please call and speak to the nurse or schedule a same day appointment so that we can address your concern without delay.   Thank you, again, for choosing me as your health care partner. I appreciate your trust and look forward to learning more about you.   Worthy Keeler, DNP, AGNP-c ___________________________________________________________  Health Maintenance Recommendations Screening Testing Mammogram Every 1 -2 years based on history and risk factors Starting at age 63 Pap Smear Ages 21-39 every 3 years Ages 85-65 every 5 years with HPV testing More frequent testing may be required based on results and history Colon Cancer Screening Every 1-10 years based on test performed, risk factors, and history Starting at age 1 Bone  Density Screening Every 2-10 years based on history Starting at age 44 for women Recommendations for men differ based on medication usage, history, and risk factors AAA Screening One time ultrasound Men 59-8 years old who have every smoked Lung Cancer Screening Low Dose Lung CT every 12 months Age 57-80 years with a 30 pack-year smoking history who still smoke or who have quit within the last 15 years  Screening Labs Routine  Labs: Complete Blood Count (CBC), Complete Metabolic Panel (CMP), Cholesterol (Lipid Panel) Every 6-12 months based on history and medications May be recommended more frequently based on current conditions or previous results Hemoglobin A1c Lab Every 3-12 months based on history and previous results Starting at age 55 or earlier with diagnosis of diabetes, high cholesterol, BMI >26,  and/or risk factors Frequent monitoring for patients with diabetes to ensure blood sugar control Thyroid Panel (TSH w/ T3 & T4) Every 6 months based on history, symptoms, and risk factors May be repeated more often if on medication HIV One time testing for all patients 43 and older May be repeated more frequently for patients with increased risk factors or exposure Hepatitis C One time testing for all patients 16 and older May be repeated more frequently for patients with increased risk factors or exposure Gonorrhea, Chlamydia Every 12 months for all sexually active persons 13-24 years Additional monitoring may be recommended for those who are considered high risk or who have symptoms PSA Men 2-47 years old with risk factors Additional screening may be recommended from age 10-69 based on risk factors, symptoms, and history  Vaccine Recommendations Tetanus Booster All adults every 10 years Flu Vaccine All patients 6 months and older every year COVID Vaccine All patients 12 years and older Initial dosing with booster May recommend additional booster based on age and health history HPV Vaccine 2 doses all patients age 21-26 Dosing may be considered for patients over 26 Shingles Vaccine (Shingrix) 2 doses all adults 30 years and older Pneumonia (Pneumovax 23) All adults 53 years and older May recommend earlier dosing based on health history Pneumonia (Prevnar 33) All adults 38 years and older Dosed 1 year after Pneumovax 23  Additional Screening, Testing, and Vaccinations may be recommended on an individualized basis based on family history, health history, risk factors, and/or exposure.  __________________________________________________________  Diet Recommendations for All Patients  I recommend that all patients maintain a diet low in saturated fats, carbohydrates, and cholesterol. While this can be challenging at first, it is not impossible and small changes can make big  differences.  Things to try: Decreasing the amount of soda, sweet tea, and/or juice to one or less per day and replace with water While water is always the first choice, if you do not like water you may consider adding a water additive without sugar to improve the taste other sugar free drinks Replace potatoes with a brightly colored vegetable at dinner Use healthy oils, such as canola oil or olive oil, instead of butter or hard margarine Limit your bread intake to two pieces or less a day Replace regular pasta with low carb pasta options Bake, broil, or grill foods instead of frying Monitor portion sizes  Eat smaller, more frequent meals throughout the day instead of large meals  An important thing to remember is, if you love foods that are not great for your health, you don't have to give them up completely. Instead, allow these foods to be a reward when you have done well. Allowing yourself  to still have special treats every once in a while is a nice way to tell yourself thank you for working hard to keep yourself healthy.   Also remember that every day is a new day. If you have a bad day and "fall off the wagon", you can still climb right back up and keep moving along on your journey!  We have resources available to help you!  Some websites that may be helpful include: www.http://carter.biz/  Www.VeryWellFit.com _____________________________________________________________  Activity Recommendations for All Patients  I recommend that all adults get at least 20 minutes of moderate physical activity that elevates your heart rate at least 5 days out of the week.  Some examples include: Walking or jogging at a pace that allows you to carry on a conversation Cycling (stationary bike or outdoors) Water aerobics Yoga Weight lifting Dancing If physical limitations prevent you from putting stress on your joints, exercise in a pool or seated in a chair are excellent options.  Do determine your  MAXIMUM heart rate for activity: YOUR AGE - 220 = MAX HeartRate   Remember! Do not push yourself too hard.  Start slowly and build up your pace, speed, weight, time in exercise, etc.  Allow your body to rest between exercise and get good sleep. You will need more water than normal when you are exerting yourself. Do not wait until you are thirsty to drink. Drink with a purpose of getting in at least 8, 8 ounce glasses of water a day plus more depending on how much you exercise and sweat.    If you begin to develop dizziness, chest pain, abdominal pain, jaw pain, shortness of breath, headache, vision changes, lightheadedness, or other concerning symptoms, stop the activity and allow your body to rest. If your symptoms are severe, seek emergency evaluation immediately. If your symptoms are concerning, but not severe, please let us know so that we can recommend further evaluation.

## 2021-06-14 LAB — CBC WITH DIFFERENTIAL/PLATELET
Basophils Absolute: 0 10*3/uL (ref 0.0–0.2)
Basos: 0 %
EOS (ABSOLUTE): 0.2 10*3/uL (ref 0.0–0.4)
Eos: 1 %
Hematocrit: 43.4 % (ref 34.0–46.6)
Hemoglobin: 14.8 g/dL (ref 11.1–15.9)
Immature Grans (Abs): 0 10*3/uL (ref 0.0–0.1)
Immature Granulocytes: 0 %
Lymphocytes Absolute: 2.4 10*3/uL (ref 0.7–3.1)
Lymphs: 22 %
MCH: 30.4 pg (ref 26.6–33.0)
MCHC: 34.1 g/dL (ref 31.5–35.7)
MCV: 89 fL (ref 79–97)
Monocytes Absolute: 0.7 10*3/uL (ref 0.1–0.9)
Monocytes: 7 %
Neutrophils Absolute: 7.4 10*3/uL — ABNORMAL HIGH (ref 1.4–7.0)
Neutrophils: 70 %
Platelets: 327 10*3/uL (ref 150–450)
RBC: 4.87 x10E6/uL (ref 3.77–5.28)
RDW: 12.7 % (ref 11.7–15.4)
WBC: 10.7 10*3/uL (ref 3.4–10.8)

## 2021-06-14 LAB — COMPREHENSIVE METABOLIC PANEL
ALT: 12 IU/L (ref 0–32)
AST: 19 IU/L (ref 0–40)
Albumin/Globulin Ratio: 1.6 (ref 1.2–2.2)
Albumin: 5.1 g/dL — ABNORMAL HIGH (ref 3.9–5.0)
Alkaline Phosphatase: 55 IU/L (ref 44–121)
BUN/Creatinine Ratio: 15 (ref 9–23)
BUN: 12 mg/dL (ref 6–20)
Bilirubin Total: 0.6 mg/dL (ref 0.0–1.2)
CO2: 20 mmol/L (ref 20–29)
Calcium: 10.1 mg/dL (ref 8.7–10.2)
Chloride: 103 mmol/L (ref 96–106)
Creatinine, Ser: 0.8 mg/dL (ref 0.57–1.00)
Globulin, Total: 3.2 g/dL (ref 1.5–4.5)
Glucose: 87 mg/dL (ref 70–99)
Potassium: 4.5 mmol/L (ref 3.5–5.2)
Sodium: 140 mmol/L (ref 134–144)
Total Protein: 8.3 g/dL (ref 6.0–8.5)
eGFR: 105 mL/min/{1.73_m2} (ref >59)

## 2021-06-14 LAB — LIPID PANEL
Chol/HDL Ratio: 3.1 ratio (ref 0.0–4.4)
Cholesterol, Total: 161 mg/dL (ref 100–199)
HDL: 52 mg/dL (ref >39)
LDL Chol Calc (NIH): 98 mg/dL (ref 0–99)
Triglycerides: 55 mg/dL (ref 0–149)
VLDL Cholesterol Cal: 11 mg/dL (ref 5–40)

## 2021-06-14 LAB — RHEUMATOID FACTOR: Rheumatoid fact SerPl-aCnc: <10 IU/mL (ref <14.0)

## 2021-06-14 LAB — VITAMIN D 25 HYDROXY (VIT D DEFICIENCY, FRACTURES): Vit D, 25-Hydroxy: 26.9 ng/mL — ABNORMAL LOW (ref 30.0–100.0)

## 2021-06-14 LAB — TSH: TSH: 2.46 u[IU]/mL (ref 0.450–4.500)

## 2021-06-15 NOTE — Progress Notes (Signed)
Orma Render, DNP, AGNP-c Primary Care & Sports Medicine [address]   [place], [place] [address] [phone]  New patient visit   Patient: Erika Durham   DOB: 19-Dec-1995   26 y.o. Female  MRN: [id number] Visit Date: 06/13/2021  Patient Care Team: Arrie Borrelli, Coralee Pesa, NP as PCP - General (Nurse Practitioner)  Today's healthcare provider: Orma Render, NP   Chief Complaint  Patient presents with   New Patient (Initial Visit)    Patient presents today to establish care. She has chronic pain all the time. Her left knee dislocations a lot, and she stated her right knee feels weaker. She would like a full lab panel today. She has never had a PCP. She states she is in pain all the time. She would like to schedule a future appointment for PAP smear she stated she has never had one. She would like a flu shot while she is in the office today   Subjective    Erika Durham is a 26 y.o. female who presents today as a new patient to establish care.    Patient endorses the following concerns presently: Chronic pain, hypermobility of joints, joint pain.   Erika Durham tells me that she has had very flexible joints as long as she can remember. 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 She feels like her symptoms worsened around age 9, but have been even worse for about the past year. She tells me the pain is an aching sensation in the joints of her ankles, knees, and hips. She also is able to hyperextend her knees, ankles, wrists, and elbows. She reports her left knee commonly dislocates and her right knee is weak. She reports there is pain associated with this. She has never had a complete work-up for this. She is not currently involved in many physical activities due to the risks of injury. She is in chronic pain over this condition and is very frustrated.   She also endorses  chronic fatigue. She tells me she always feels tired and she has a very hard time waking in the mornings. She is not sure if this is related to her depression or the joint condition.   History reviewed and reveals the following: Past Medical History:  Diagnosis Date   Anxiety    Depression    Migraine    Panic attack    Past Surgical History:  Procedure Laterality Date   WISDOM TOOTH EXTRACTION     Family Status  Relation Name Status   Mother  Alive   Father  Alive   Sister  (Not Specified)   Family History  Problem Relation Age of Onset   Healthy Mother    Anxiety disorder Mother    Healthy Father    Depression Sister    Anxiety disorder Sister    Social History   Socioeconomic History   Marital status: Single    Spouse name: Not on file   Number of children: 0   Years of education: Not on file   Highest education level: Some college, no degree  Occupational History   Not on file  Tobacco Use   Smoking status: Never   Smokeless tobacco: Never  Vaping Use   Vaping Use: Never used  Substance and Sexual Activity   Alcohol use: Yes    Comment: socially   Drug use: Yes  Types: Marijuana    Comment: socially   Sexual activity: Not on file  Other Topics Concern   Not on file  Social History Narrative   Not on file   Social Determinants of Health   Financial Resource Strain: Not on file  Food Insecurity: Not on file  Transportation Needs: Not on file  Physical Activity: Not on file  Stress: Not on file  Social Connections: Not on file   Outpatient Medications Prior to Visit  Medication Sig   escitalopram (LEXAPRO) 20 MG tablet Take by mouth.   gabapentin (NEURONTIN) 100 MG capsule Take by mouth.   Melatonin 3 MG CAPS Take by mouth.   propranolol (INDERAL) 20 MG tablet Take by mouth.   albuterol (VENTOLIN HFA) 108 (90 Base) MCG/ACT inhaler Inhale 2 puffs into the lungs every 6 (six) hours as needed for wheezing or shortness of breath.   benzonatate  (TESSALON) 100 MG capsule Take 1 capsule (100 mg total) by mouth 3 (three) times daily as needed for cough.   clonazePAM (KLONOPIN) 0.5 MG tablet Take 0.25 mg by mouth daily as needed for anxiety.   doxycycline (VIBRA-TABS) 100 MG tablet Take 1 tablet (100 mg total) by mouth 2 (two) times daily.   traZODone (DESYREL) 50 MG tablet Take 1 tablet (50 mg total) by mouth at bedtime as needed for sleep.   [DISCONTINUED] escitalopram (LEXAPRO) 5 MG tablet Take 1 tablet (5 mg total) by mouth daily.   No facility-administered medications prior to visit.   Allergies  Allergen Reactions   Banana Itching and Swelling   Rizatriptan Anxiety    Other reaction(s): Other (See Comments) Body wide migraine.  Right side body pain.    Immunization History  Administered Date(s) Administered   Influenza,inj,Quad PF,6+ Mos 06/13/2021   PPD Test 01/24/2015, 08/14/2020    Health Maintenance Due: Health Maintenance  Topic Date Due   COVID-19 Vaccine (1) Never done   HPV VACCINES (1 - 2-dose series) Never done   HIV Screening  Never done   Hepatitis C Screening  Never done   TETANUS/TDAP  Never done   PAP-Cervical Cytology Screening  Never done   PAP SMEAR-Modifier  Never done   INFLUENZA VACCINE  Completed    Review of Systems All review of systems negative except what is listed in the HPI   Objective    BP 117/82    Pulse 98    Ht 5' 1"  (1.549 m)    Wt 169 lb 4.8 oz (76.8 kg)    SpO2 98%    BMI 31.99 kg/m  Physical Exam Vitals and nursing note reviewed.  Constitutional:      Appearance: Normal appearance.  HENT:     Head: Normocephalic.  Eyes:     Extraocular Movements: Extraocular movements intact.     Conjunctiva/sclera: Conjunctivae normal.     Pupils: Pupils are equal, round, and reactive to light.  Cardiovascular:     Rate and Rhythm: Normal rate and regular rhythm.     Pulses: Normal pulses.     Heart sounds: Normal heart sounds.  Pulmonary:     Effort: Pulmonary effort is  normal.     Breath sounds: Normal breath sounds.  Abdominal:     General: Bowel sounds are normal.     Palpations: Abdomen is soft.  Musculoskeletal:     Comments: Extension of the knees and elbows > 180 degrees bilaterally. Hyperextension of wrist bilaterally >180 degrees. Hyperextension of thumb to anterior wrist present bilaterally.  No noted laxity in large joints with full ROM present. No crepitus present. No edema, erythema, or warmth. No bony abnormalities appreciated.   Skin:    General: Skin is warm and dry.     Capillary Refill: Capillary refill takes less than 2 seconds.  Neurological:     General: No focal deficit present.     Mental Status: She is alert and oriented to person, place, and time.     Deep Tendon Reflexes: Reflexes normal.  Psychiatric:        Mood and Affect: Mood normal.        Thought Content: Thought content normal.        Judgment: Judgment normal.    Results for orders placed or performed in visit on 06/13/21  CBC with Differential/Platelet  Result Value Ref Range   WBC 10.7 3.4 - 10.8 x10E3/uL   RBC 4.87 3.77 - 5.28 x10E6/uL   Hemoglobin 14.8 11.1 - 15.9 g/dL   Hematocrit 43.4 34.0 - 46.6 %   MCV 89 79 - 97 fL   MCH 30.4 26.6 - 33.0 pg   MCHC 34.1 31.5 - 35.7 g/dL   RDW 12.7 11.7 - 15.4 %   Platelets 327 150 - 450 x10E3/uL   Neutrophils 70 Not Estab. %   Lymphs 22 Not Estab. %   Monocytes 7 Not Estab. %   Eos 1 Not Estab. %   Basos 0 Not Estab. %   Neutrophils Absolute 7.4 (H) 1.4 - 7.0 x10E3/uL   Lymphocytes Absolute 2.4 0.7 - 3.1 x10E3/uL   Monocytes Absolute 0.7 0.1 - 0.9 x10E3/uL   EOS (ABSOLUTE) 0.2 0.0 - 0.4 x10E3/uL   Basophils Absolute 0.0 0.0 - 0.2 x10E3/uL   Immature Granulocytes 0 Not Estab. %   Immature Grans (Abs) 0.0 0.0 - 0.1 x10E3/uL  Comprehensive metabolic panel  Result Value Ref Range   Glucose 87 70 - 99 mg/dL   BUN 12 6 - 20 mg/dL   Creatinine, Ser 0.80 0.57 - 1.00 mg/dL   eGFR 105 >59 mL/min/1.73    BUN/Creatinine Ratio 15 9 - 23   Sodium 140 134 - 144 mmol/L   Potassium 4.5 3.5 - 5.2 mmol/L   Chloride 103 96 - 106 mmol/L   CO2 20 20 - 29 mmol/L   Calcium 10.1 8.7 - 10.2 mg/dL   Total Protein 8.3 6.0 - 8.5 g/dL   Albumin 5.1 (H) 3.9 - 5.0 g/dL   Globulin, Total 3.2 1.5 - 4.5 g/dL   Albumin/Globulin Ratio 1.6 1.2 - 2.2   Bilirubin Total 0.6 0.0 - 1.2 mg/dL   Alkaline Phosphatase 55 44 - 121 IU/L   AST 19 0 - 40 IU/L   ALT 12 0 - 32 IU/L  Lipid panel  Result Value Ref Range   Cholesterol, Total 161 100 - 199 mg/dL   Triglycerides 55 0 - 149 mg/dL   HDL 52 >39 mg/dL   VLDL Cholesterol Cal 11 5 - 40 mg/dL   LDL Chol Calc (NIH) 98 0 - 99 mg/dL   Chol/HDL Ratio 3.1 0.0 - 4.4 ratio  TSH  Result Value Ref Range   TSH 2.460 0.450 - 4.500 uIU/mL  VITAMIN D 25 Hydroxy (Vit-D Deficiency, Fractures)  Result Value Ref Range   Vit D, 25-Hydroxy 26.9 (L) 30.0 - 100.0 ng/mL  Rheumatoid Factor  Result Value Ref Range   Rhuematoid fact SerPl-aCnc <10.0 <14.0 IU/mL    Assessment & Plan      Problem List Items  Addressed This Visit     Generalized hypermobility of joints - Primary    Generalized hyperextension and hypermobility present to wrists, elbows, and knees evaluated today.  No current injury or dislocation appreciated.  Given length of time symptoms have been present, suspect possible underlying pathology.  Recommend screening for connective tissue conditions initially, but consider Ehlers-Danlos. No family hx of the same reported.  Will monitor labs and consider rheumatology referral based on results.        Relevant Orders   CBC with Differential/Platelet (Completed)   Comprehensive metabolic panel (Completed)   Lipid panel (Completed)   TSH (Completed)   VITAMIN D 25 Hydroxy (Vit-D Deficiency, Fractures) (Completed)   Rheumatoid Factor (Completed)   ANA Direct w/Reflex if Positive   C-reactive protein   Generalized anxiety disorder    Currently managed by psychiatry  and counseling services.  Positive screening today with no alarm symptoms.  Recommend discussion with mental health provider to evaluate for need for medication changes given the severity of symptoms.       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  No current injury detected.  Consider osteoarthritis or possible CT d/o given the history of symptoms and severity.  Will begin screening today, but will likely need rheumatology work-up for complete evaluation and recommendations.       Relevant Medications   cyclobenzaprine (FLEXERIL) 5 MG tablet   meloxicam (MOBIC) 15 MG tablet   escitalopram (LEXAPRO) 20 MG tablet   gabapentin (NEURONTIN) 100 MG capsule   Hip pain, bilateral    Chronic.  No injury or alarm symptoms present today.  Concerns for possible CT d/o.  Will begin screening today, but will likely need rheumatology evaluation for complete work-up.       Relevant Medications   cyclobenzaprine (FLEXERIL) 5 MG tablet   meloxicam (MOBIC) 15 MG tablet   Other Relevant Orders   C-reactive protein   Establishing care with new doctor, encounter for   Relevant Orders   Flu Vaccine QUAD 6+ mos PF IM (Fluarix Quad PF) (Completed)   MDD (major depressive disorder), severe (HCC)    Positive PHQ-9 today. No alarm symptoms present at this time.  Followed by psychiatry and counseling. Recommend f/u for evaluation of current medications and need for adjustment based on severity of symptoms.  Joint condition and chronic fatigue may be related.      Relevant Medications   escitalopram (LEXAPRO) 20 MG tablet     No follow-ups on file.     Time: 60 minutes, >50% spent counseling, care coordination, chart review, and documentation.    Mariany Mackintosh, Coralee Pesa, NP, DNP, AGNP-C Primary Care & Sports Medicine at [place]

## 2021-06-18 DIAGNOSIS — F411 Generalized anxiety disorder: Secondary | ICD-10-CM | POA: Insufficient documentation

## 2021-06-18 DIAGNOSIS — M2480 Other specific joint derangements of unspecified joint, not elsewhere classified: Secondary | ICD-10-CM | POA: Insufficient documentation

## 2021-06-18 DIAGNOSIS — G8929 Other chronic pain: Secondary | ICD-10-CM | POA: Insufficient documentation

## 2021-06-18 DIAGNOSIS — M25551 Pain in right hip: Secondary | ICD-10-CM | POA: Insufficient documentation

## 2021-06-18 DIAGNOSIS — M25562 Pain in left knee: Secondary | ICD-10-CM | POA: Insufficient documentation

## 2021-06-18 DIAGNOSIS — Z7689 Persons encountering health services in other specified circumstances: Secondary | ICD-10-CM | POA: Insufficient documentation

## 2021-06-18 DIAGNOSIS — M25552 Pain in left hip: Secondary | ICD-10-CM | POA: Insufficient documentation

## 2021-06-18 NOTE — Assessment & Plan Note (Signed)
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  No current injury detected.  Consider osteoarthritis or possible CT d/o given the history of symptoms and severity.  Will begin screening today, but will likely need rheumatology work-up for complete evaluation and recommendations.

## 2021-06-18 NOTE — Assessment & Plan Note (Signed)
Positive PHQ-9 today. No alarm symptoms present at this time.  Followed by psychiatry and counseling. Recommend f/u for evaluation of current medications and need for adjustment based on severity of symptoms.  Joint condition and chronic fatigue may be related.

## 2021-06-18 NOTE — Assessment & Plan Note (Signed)
Generalized hyperextension and hypermobility present to wrists, elbows, and knees evaluated today.  No current injury or dislocation appreciated.  Given length of time symptoms have been present, suspect possible underlying pathology.  Recommend screening for connective tissue conditions initially, but consider Ehlers-Danlos. No family hx of the same reported.  Will monitor labs and consider rheumatology referral based on results.

## 2021-06-18 NOTE — Assessment & Plan Note (Signed)
Chronic.  No injury or alarm symptoms present today.  Concerns for possible CT d/o.  Will begin screening today, but will likely need rheumatology evaluation for complete work-up.

## 2021-06-18 NOTE — Assessment & Plan Note (Signed)
Currently managed by psychiatry and counseling services.  Positive screening today with no alarm symptoms.  Recommend discussion with mental health provider to evaluate for need for medication changes given the severity of symptoms.

## 2021-06-22 ENCOUNTER — Telehealth (HOSPITAL_BASED_OUTPATIENT_CLINIC_OR_DEPARTMENT_OTHER): Payer: Self-pay

## 2021-06-22 DIAGNOSIS — M2480 Other specific joint derangements of unspecified joint, not elsewhere classified: Secondary | ICD-10-CM

## 2021-07-11 ENCOUNTER — Ambulatory Visit (HOSPITAL_BASED_OUTPATIENT_CLINIC_OR_DEPARTMENT_OTHER): Payer: No Typology Code available for payment source | Attending: Nurse Practitioner | Admitting: Physical Therapy

## 2021-07-11 ENCOUNTER — Other Ambulatory Visit: Payer: Self-pay

## 2021-07-11 ENCOUNTER — Encounter (HOSPITAL_BASED_OUTPATIENT_CLINIC_OR_DEPARTMENT_OTHER): Payer: Self-pay | Admitting: Physical Therapy

## 2021-07-11 DIAGNOSIS — R262 Difficulty in walking, not elsewhere classified: Secondary | ICD-10-CM | POA: Diagnosis present

## 2021-07-11 DIAGNOSIS — M6281 Muscle weakness (generalized): Secondary | ICD-10-CM | POA: Insufficient documentation

## 2021-07-11 DIAGNOSIS — M25561 Pain in right knee: Secondary | ICD-10-CM | POA: Insufficient documentation

## 2021-07-11 DIAGNOSIS — M2480 Other specific joint derangements of unspecified joint, not elsewhere classified: Secondary | ICD-10-CM | POA: Diagnosis not present

## 2021-07-11 DIAGNOSIS — M545 Low back pain, unspecified: Secondary | ICD-10-CM | POA: Insufficient documentation

## 2021-07-11 NOTE — Therapy (Signed)
OUTPATIENT PHYSICAL THERAPY LOWER EXTREMITY EVALUATION   Patient Name: Erika Durham MRN: [id number] DOB:[birth year], 26 y.o., female Today's Date: 07/11/2021   PT End of Session - 07/11/21 1442     Visit Number 1    Number of Visits 19    Date for PT Re-Evaluation 10/09/21    Authorization Type [employer]    PT Start Time 1435    PT Stop Time 1515    PT Time Calculation (min) 40 min    Activity Tolerance Patient tolerated treatment well    Behavior During Therapy [facility] for tasks assessed/performed             Past Medical History:  Diagnosis Date   Anxiety    Depression    Migraine    Panic attack    Past Surgical History:  Procedure Laterality Date   WISDOM TOOTH EXTRACTION     Patient Active Problem List   Diagnosis Date Noted   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   Generalized anxiety disorder 06/18/2021   Chronic pain of left knee 06/18/2021   Hip pain, bilateral 06/18/2021   Establishing care with new doctor, encounter for 06/18/2021   MDD (major depressive disorder), severe ([place]) 08/14/2020    PCP: Orma Render, NP  REFERRING PROVIDER: Orma Render, NP  REFERRING DIAG: M24.80 (ICD-10-CM) - Generalized hypermobility of joints  THERAPY DIAG:  Pain, lumbar region  Muscle weakness (generalized)  Difficulty walking  Pain in joint of right knee  ONSET DATE:  Dec 2022  SUBJECTIVE:   SUBJECTIVE STATEMENT: Pt states that her hypermobility and flexibility has been going on since she was 15 yr. She reports she just recently discovered that her grandmother is hypermobile. Her left knee dislocates a lot, and she stated her L knee feels weaker. She states that it "popped out 3 separate time." She was just walking. She states that her jaw will also pop audibly and have jaw pain. She states walking longer dstiances is painful. She started using and SPC and is considering getting a WC. Pt states that meloxicam makes her drowsy. She currently has to walk  and stand a lot at work and has pain in bilateral legs after a long day. When she has a good day, she is able to do things by herself but will require help from her partner in order finish daily tasks.   PERTINENT HISTORY: Anxiety, depression  PAIN:  Are you having pain? Yes NPRS scale: 4/10 Pain location: bilateral hips low back, shoulder Pain orientation: Right and Left  PAIN TYPE: aching  Aggravating factors: standing, working  Relieving factors: yoga  PRECAUTIONS: None  WEIGHT BEARING RESTRICTIONS No  FALLS:  Has patient fallen in last 6 months? Yes, Number of falls: 5  LIVING ENVIRONMENT: Lives with: lives with their family and lives with their partner Lives in: House/apartment Stairs: Yes; 2nd floor, difficulty with stairs- sometimes mobility going out due to fear/pain Has following equipment at home: Single point cane  OCCUPATION: Development worker, community, Careers adviser currently  PLOF: Independent with basic ADLs  PATIENT GOALS : Pt states she would like to gain stability, reduce pain, and prolonging decline.    OBJECTIVE:   DIAGNOSTIC FINDINGS: N/A  PATIENT SURVEYS:  FOTO 26  55 pts  6pts  COGNITION:  Overall cognitive status: Within functional limits for tasks assessed     SENSATION:  Light touch: Appears intact   MUSCLE LENGTH: WNL at knee and hip   POSTURE:  Femoral IR and mild genu  valgus in standing, standing knee hyper ext on L, WNL on R  PALPATION: No TTP noted in quad  Significant apprehension with patellar glides on L and R  LE AROM/PROM: WNL at hip and knee  LE MMT:  MMT Right 07/11/2021 Left 07/11/2021  Hip flexion 4/5 4/5  Hip extension    Hip abduction 4/5 4/5  Hip adduction 4/5 4/5  Knee flexion 4/5 4/5  Knee extension 4/5 4/5   (Blank rows = not tested)  LOWER EXTREMITY SPECIAL TESTS:  Knee special tests: Patellafemoral apprehension test: positive and Lateral pull sign: positive  Beighton Score: 5 / 9 = 55.6 %  FUNCTIONAL  TESTS:  Squat: diminished depth, quad dominant motion, pain/apprehension at depth  GAIT: Distance walked: 77ft Assistive device utilized: Single point cane Level of assistance: Complete Independence Comments: WFL, slight toe out position    TODAY'S TREATMENT:  Exercises Yellow TB Clamshell with Resistance - 1 x daily - 7 x weekly - 3 sets - 10 reps Supine Bridge with Resistance Band - 1 x daily - 7 x weekly - 3 sets - 10 reps Seated Isometric Knee Extension - 1 x daily - 7 x weekly - 3 sets - 10 reps    PATIENT EDUCATION:  Education details: MOI, diagnosis, prognosis, anatomy, exercise progression, DOMS expectations, muscle firing,  envelope of function, HEP, POC  Person educated: Patient Education method: Explanation, Demonstration, Tactile cues, Verbal cues, and Handouts Education comprehension: verbalized understanding, returned demonstration, verbal cues required, and tactile cues required   HOME EXERCISE PROGRAM: Access Code: [email or web address] URL: [email or web address] Date: 07/11/2021 Prepared by: Daleen Bo  ASSESSMENT:  CLINICAL IMPRESSION: Patient is a 26 y.o. female who was seen today for physical therapy evaluation and treatment for cc of chronic LE and L knee pain. Pt's s/s appear consistent with history of hypermobility and significant, frequent patellar dislocation of L knee. Time did not allow for testing for internal derangement, especially with pt's level of apprehension with patellar gliding. Pt's pain is moderately sensitive and irritable with movement. Pt will likely require more stability based exercise and gentle intro to strength training at the hip and knee, bilaterally, in order to progress daily function and reducing pain with activity.   OBJECTIVE IMPAIRMENTS Abnormal gait, decreased activity tolerance, decreased balance, decreased mobility, difficulty walking, decreased ROM, decreased strength, increased muscle spasms, improper body mechanics,  postural dysfunction, pain, and hypermobility  ACTIVITY LIMITATIONS cleaning, community activity, driving, occupation, Medical sales representative, yard work, shopping, and Naval architect .   PERSONAL FACTORS Behavior pattern, Fitness, Time since onset of injury/illness/exacerbation, and 1-2 comorbidities:    are also affecting patient's functional outcome.    REHAB POTENTIAL: Fair    CLINICAL DECISION MAKING: Evolving/moderate complexity  EVALUATION COMPLEXITY: Moderate   GOALS:   SHORT TERM GOALS:  Pt will become independent with HEP in order to demonstrate synthesis of PT education.   Target date: 08/22/2021 Goal status: INITIAL  2.  Pt will report at least 2 pt reduction on NPRS scale for pain during squatting/bending in order to demonstrate functional improvement with household activity, self care, and ADL.   Target date: 08/22/2021 Goal status: INITIAL  3.  Pt will score at least 6 pt increase on FOTO to demonstrate functional improvement in MCII and pt perceived function.    Target date: 08/22/2021 Goal status: INITIAL  LONG TERM GOALS:  Pt  will become independent with final HEP in order to demonstrate synthesis of PT education.   Target date: 10/03/2021 Goal status: INITIAL  2.  Pt will be able to demonstrate step/up down without UE support or pain in order to demonstrate functional improvement in LE function for self-care and house hold duties.   Target date: 10/03/2021 Goal status: INITIAL  3.    Target date: 10/03/2021 Goal status: INITIAL  4.  Pt will score >/= 43 on FOTO to demonstrate improvement in perceived general function and mobility.   Target date: 10/03/2021 Goal status: INITIAL   PLAN: PT FREQUENCY: 1-2x/week  PT DURATION: 12 weeks (likely D/C by 10 wks)  PLANNED INTERVENTIONS: Therapeutic exercises, Therapeutic activity, Neuromuscular re-education, Balance training, Gait training, Patient/Family education, Joint manipulation, Joint mobilization,  Stair training, Prosthetic training, DME instructions, Aquatic Therapy, Dry Needling, Electrical stimulation, Spinal manipulation, Spinal mobilization, Cryotherapy, Moist heat, Splintting, Taping, Vasopneumatic device, Traction, Ultrasound, Ionotophoresis 4mg /ml Dexamethasone, and Manual therapy  PLAN FOR NEXT SESSION: review HEP, taping, quad and hip strength, stability exercise, progress angle of quad isometrics    Daleen Bo, PT 07/11/2021, 4:44 PM

## 2021-07-19 ENCOUNTER — Ambulatory Visit (HOSPITAL_BASED_OUTPATIENT_CLINIC_OR_DEPARTMENT_OTHER): Payer: No Typology Code available for payment source | Admitting: Physical Therapy

## 2021-07-24 ENCOUNTER — Telehealth (HOSPITAL_BASED_OUTPATIENT_CLINIC_OR_DEPARTMENT_OTHER): Payer: Self-pay | Admitting: Physical Therapy

## 2021-07-24 ENCOUNTER — Ambulatory Visit (HOSPITAL_BASED_OUTPATIENT_CLINIC_OR_DEPARTMENT_OTHER): Payer: No Typology Code available for payment source | Admitting: Physical Therapy

## 2021-07-24 NOTE — Telephone Encounter (Signed)
LVM regarding no show today, advised of next appt time and attendance policy. Requested she call back if she needs to RS.  ?Drew Herman C. Ariani Seier PT, DPT ?07/24/21 3:15 PM ? ?

## 2021-08-02 ENCOUNTER — Ambulatory Visit (HOSPITAL_BASED_OUTPATIENT_CLINIC_OR_DEPARTMENT_OTHER): Payer: No Typology Code available for payment source | Admitting: Physical Therapy

## 2021-08-02 ENCOUNTER — Encounter (HOSPITAL_BASED_OUTPATIENT_CLINIC_OR_DEPARTMENT_OTHER): Payer: Self-pay | Admitting: Physical Therapy

## 2021-08-02 DIAGNOSIS — M545 Low back pain, unspecified: Secondary | ICD-10-CM

## 2021-08-02 DIAGNOSIS — M6281 Muscle weakness (generalized): Secondary | ICD-10-CM

## 2021-08-02 DIAGNOSIS — R262 Difficulty in walking, not elsewhere classified: Secondary | ICD-10-CM

## 2021-08-02 DIAGNOSIS — M25561 Pain in right knee: Secondary | ICD-10-CM

## 2021-08-02 NOTE — Therapy (Signed)
?OUTPATIENT PHYSICAL THERAPY TREATMENT NOTE ? ? ?Patient Name: Erika Durham ?MRN: [id number] ?DOB:Jul 12, 1995, 26 y.o., female ?Today's Date: 08/02/2021 ? ?PCP: Orma Render, NP ?REFERRING PROVIDER: Orma Render, NP ? ? PT End of Session - 08/02/21 1102   ? ? Visit Number 2   ? Number of Visits 19   ? Date for PT Re-Evaluation 10/09/21   ? Authorization Type [employer]et[employer]   ? PT Start Time 1015   ? PT Stop Time 1102   ? PT Time Calculation (min) 47 min   ? Activity Tolerance Patient tolerated treatment well   ? Behavior During Therapy [facility] for tasks assessed/performed   ? ?  ?  ? ?  ? ? ?Past Medical History:  ?Diagnosis Date  ? Anxiety   ? Depression   ? Migraine   ? Panic attack   ? ?Past Surgical History:  ?Procedure Laterality Date  ? WISDOM TOOTH EXTRACTION    ? ?Patient Active Problem List  ? Diagnosis Date Noted  ? 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 Generalized anxiety disorder 06/18/2021  ? Chronic pain of left knee 06/18/2021  ? Hip pain, bilateral 06/18/2021  ? Establishing care with new doctor, encounter for 06/18/2021  ? MDD (major depressive disorder), severe ([place]) 08/14/2020  ? ? ?REFERRING DIAG: M24.80 (ICD-10-CM) - Generalized hypermobility of joints ? ?THERAPY DIAG:  ?Pain, lumbar region ? ?Muscle weakness (generalized) ? ?Pain in joint of right knee ? ?Difficulty walking ? ?PERTINENT HISTORY: anxiety, depression, patellar dislocation x7 ? ?PRECAUTIONS: none ? ?SUBJECTIVE: I could do the exercises but they did wear me out a little. I am using a wheelchair about 50% of the time because of my knees buckling.   ? ?PAIN:  ?Are you having pain? Yes: NPRS scale: 6/10 ?Pain location: both knees ?Pain description: tired ?Aggravating factors: walking ?Relieving factors: rest ? ? ?OBJECTIVE:  ?  ?DIAGNOSTIC FINDINGS: N/A ?  ?PATIENT SURVEYS:  ?FOTO 101 ?  ?55 pts  ?6pts ?  ?COGNITION: ?         Overall cognitive status: Within functional limits for tasks assessed              ?          ?SENSATION: ?          Light touch: Appears intact ?  ?  ?MUSCLE LENGTH: ?WNL at knee and hip  ?  ?POSTURE:  ?Femoral IR and mild genu valgus in standing, standing knee hyper ext on L, WNL on R ?  ?PALPATION: ?No TTP noted in quad  ?Significant apprehension with patellar glides on L and R ?  ?LE AROM/PROM: WNL at hip and knee ?  ?LE MMT: ?  ?MMT Right ?07/11/2021 Left ?07/11/2021  ?Hip flexion 4/5 4/5  ?Hip extension      ?Hip abduction 4/5 4/5  ?Hip adduction 4/5 4/5  ?Knee flexion 4/5 4/5  ?Knee extension 4/5 4/5  ? (Blank rows = not tested) ?  ?LOWER EXTREMITY SPECIAL TESTS:  ?Knee special tests: Patellafemoral apprehension test: positive and Lateral pull sign: positive ?  ?Beighton Score: 5 / 9 = 55.6 % ?  ?FUNCTIONAL TESTS:  ?Squat: diminished depth, quad dominant motion, pain/apprehension at depth ?  ?GAIT: ?Distance walked: 43ft ?Assistive device utilized: Single point cane ?Level of assistance: Complete Independence ?Comments: WFL, slight toe out position ?  ?  ?  ?TODAY'S TREATMENT: ?3/30: ?Walk to/from pool with education regarding aquatics ?Bike 5 min L2 ?Sit<>stand ball bw knees,  no UE assist x15 ?Heel raises & gastroc stretch ?Standing hip abd in slight turnout ?Plank- hands on mat with heel raises ?MANUAL: ktape general for bil patella with education for self applications ? ?  ?  ?  ?PATIENT EDUCATION:  ?Education details: exercise form/rationale, aquatics ?Person educated: Patient ?Education method: Explanation, Demonstration, Tactile cues, Verbal cues, and Handouts ?Education comprehension: verbalized understanding, returned demonstration, verbal cues required, and tactile cues required ?  ?  ?HOME EXERCISE PROGRAM: ?Access Code: [email or web address] ?URL: [email or web address] ? ?  ?ASSESSMENT: ?  ?CLINICAL IMPRESSION: ?Pt fatigued with exercises meaning we are working at a good level. Cues focused on awareness of motion/muscle engagment to avoid moving into excessive ranges that place her at risk for injury. I do  think she will benefit from aquatic therapy to improve her overall endurance and has a pool at her appt that she will be able to use when it gets warmer. States that she plans on moving soon though. Has a stationary bike in the gym at her appt that she has a good walk to get to. 2 large dogs that she likes to walk.  ?  ?  ?OBJECTIVE IMPAIRMENTS Abnormal gait, decreased activity tolerance, decreased balance, decreased mobility, difficulty walking, decreased ROM, decreased strength, increased muscle spasms, improper body mechanics, postural dysfunction, pain, and hypermobility ?  ?ACTIVITY LIMITATIONS cleaning, community activity, driving, occupation, Medical sales representative, yard work, shopping, and Naval architect .  ?  ?PERSONAL FACTORS Behavior pattern, Fitness, Time since onset of injury/illness/exacerbation, and 1-2 comorbidities:    are also affecting patient's functional outcome.  ?  ?  ?REHAB POTENTIAL: Fair   ?  ?CLINICAL DECISION MAKING: Evolving/moderate complexity ?  ?EVALUATION COMPLEXITY: Moderate ?  ?  ?GOALS: ?  ?  ?SHORT TERM GOALS: ?  ?Pt will become independent with HEP in order to demonstrate synthesis of PT education. ?  ?  ?Target date: 08/22/2021 ?Goal status: INITIAL ?  ?2.  Pt will report at least 2 pt reduction on NPRS scale for pain during squatting/bending in order to demonstrate functional improvement with household activity, self care, and ADL.  ?  ?Target date: 08/22/2021 ?Goal status: INITIAL ?  ?3.  Pt will score at least 6 pt increase on FOTO to demonstrate functional improvement in MCII and pt perceived function.   ?  ?Target date: 08/22/2021 ?Goal status: INITIAL ?  ?LONG TERM GOALS: ?  ?Pt  will become independent with final HEP in order to demonstrate synthesis of PT education. ?  ?  ?Target date: 10/03/2021 ?Goal status: INITIAL ?  ?2.  Pt will be able to demonstrate step/up down without UE support or pain in order to demonstrate functional improvement in LE function for self-care and house  hold duties. ?  ?  ?Target date: 10/03/2021 ?Goal status: INITIAL ?  ?3.   ?  ?Target date: 10/03/2021 ?Goal status: INITIAL ?  ?4.  Pt will score >/= 43 on FOTO to demonstrate improvement in perceived general function and mobility.  ?  ?Target date: 10/03/2021 ?Goal status: INITIAL ?  ?  ?PLAN: ?PT FREQUENCY: 1-2x/week ?  ?PT DURATION: 12 weeks (likely D/C by 10 wks) ?  ?PLANNED INTERVENTIONS: Therapeutic exercises, Therapeutic activity, Neuromuscular re-education, Balance training, Gait training, Patient/Family education, Joint manipulation, Joint mobilization, Stair training, Prosthetic training, DME instructions, Aquatic Therapy, Dry Needling, Electrical stimulation, Spinal manipulation, Spinal mobilization, Cryotherapy, Moist heat, Splintting, Taping, Vasopneumatic device, Traction, Ultrasound, Ionotophoresis 4mg /ml Dexamethasone, and Manual therapy ?  ?PLAN FOR NEXT SESSION: begin aquatic therapy-  starting with central lumbopelvic stabilization ?  ? ?Ivann Trimarco C. Ayven Pheasant PT, DPT ?08/02/21 12:09 PM ? ? ?   ?

## 2021-08-03 ENCOUNTER — Ambulatory Visit (HOSPITAL_COMMUNITY)
Admission: EM | Admit: 2021-08-03 | Discharge: 2021-08-03 | Disposition: A | Discharge status: Home / Self Care | Payer: No Typology Code available for payment source | Attending: Internal Medicine | Admitting: Internal Medicine

## 2021-08-03 ENCOUNTER — Encounter (HOSPITAL_COMMUNITY): Payer: Self-pay | Admitting: Emergency Medicine

## 2021-08-03 DIAGNOSIS — N3090 Cystitis, unspecified without hematuria: Secondary | ICD-10-CM | POA: Insufficient documentation

## 2021-08-03 LAB — POCT URINALYSIS DIPSTICK, ED / UC
Bilirubin Urine: NEGATIVE
Glucose, UA: NEGATIVE mg/dL
Nitrite: NEGATIVE
Protein, ur: 100 mg/dL — AB
Specific Gravity, Urine: >=1.03 (ref 1.005–1.030)
Urobilinogen, UA: 1 mg/dL (ref 0.0–1.0)
pH: 6.5 (ref 5.0–8.0)

## 2021-08-03 LAB — POC URINE PREG, ED: Preg Test, Ur: NEGATIVE

## 2021-08-03 MED ORDER — CIPROFLOXACIN HCL 500 MG PO TABS: 500.0000 mg | ORAL_TABLET | Freq: Two times a day (BID) | ORAL | 0 refills | Status: AC

## 2021-08-03 NOTE — ED Provider Notes (Signed)
?[facility] ? ? ? ?CSN: [id number] ?Arrival date & time: 08/03/21  1702 ? ? ?  ? ?History   ?Chief Complaint ?Chief Complaint  ?Patient presents with  ? Urinary Frequency  ? Dysuria  ? ? ?HPI ?Erika Durham is a 26 y.o. female.  ? ?The patient is a 26 year old female who presents for urinary symptoms.  Patient states symptoms started this morning.  Planes of urinary frequency, urgency, suprapubic pressure, back pain, and flank pain.  States that she also noticed that she had a fever of 100, chills and had an episode of hematuria..  States that she did have sex about 3 days prior to the onset of her symptoms and that she forgot to urinate after sexual intercourse.  She denies abdominal pain, vaginal discharge, vaginal odor, vaginal itching.  She is not concerned about an STI/STD.  She is not requesting testing today.  States her last UTI was approximately 1 year ago. ? ? ? ?Past Medical History:  ?Diagnosis Date  ? Anxiety   ? Depression   ? Migraine   ? Panic attack   ? ? ?Patient Active Problem List  ? Diagnosis Date Noted  ? 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 Generalized anxiety disorder 06/18/2021  ? Chronic pain of left knee 06/18/2021  ? Hip pain, bilateral 06/18/2021  ? Establishing care with new doctor, encounter for 06/18/2021  ? MDD (major depressive disorder), severe (HCC) 08/14/2020  ? ? ?Past Surgical History:  ?Procedure Laterality Date  ? WISDOM TOOTH EXTRACTION    ? ? ?OB History   ?No obstetric history on file. ?  ? ? ? ?Home Medications   ? ?Prior to Admission medications   ?Medication Sig Start Date End Date Taking? Authorizing Provider  ?ciprofloxacin (CIPRO) 500 MG tablet Take 1 tablet (500 mg total) by mouth every 12 (twelve) hours for 7 days. 08/03/21 08/10/21 Yes Dosha Broshears-Warren, Sadie Haberhristie J, NP  ?albuterol (VENTOLIN HFA) 108 (90 Base) MCG/ACT inhaler Inhale 2 puffs into the lungs every 6 (six) hours as needed for wheezing or shortness of breath. 04/25/21   Waldon MerlMartin, William  C, PA-C  ?benzonatate (TESSALON) 100 MG capsule Take 1 capsule (100 mg total) by mouth 3 (three) times daily as needed for cough. ?Patient not taking: Reported on 07/11/2021 04/25/21   Waldon MerlMartin, William C, PA-C  ?clonazePAM (KLONOPIN) 0.5 MG tablet Take 0.25 mg by mouth daily as needed for anxiety.    [provider]  ?cyclobenzaprine (FLEXERIL) 5 MG tablet Take 1 tablet (5 mg total) by mouth 3 (three) times daily as needed for muscle spasms. 06/13/21   Tollie EthEarly, Sara E, NP  ?doxycycline (VIBRA-TABS) 100 MG tablet Take 1 tablet (100 mg total) by mouth 2 (two) times daily. ?Patient not taking: Reported on 07/11/2021 04/25/21   Waldon MerlMartin, William C, PA-C  ?escitalopram (LEXAPRO) 20 MG tablet Take by mouth. 09/28/20   [provider]  ?gabapentin (NEURONTIN) 100 MG capsule Take by mouth. 09/27/20   [provider]  ?Melatonin 3 MG CAPS Take by mouth. 09/27/20   [provider]  ?meloxicam (MOBIC) 15 MG tablet Take 1 tablet (15 mg total) by mouth daily. 06/13/21   Tollie EthEarly, Sara E, NP  ?propranolol (INDERAL) 20 MG tablet Take by mouth. 09/27/20   [provider]  ?traZODone (DESYREL) 50 MG tablet Take 1 tablet (50 mg total) by mouth at bedtime as needed for sleep. ?Patient not taking: Reported on 07/11/2021 06/14/20   Money, Gerlene Burdockravis B, FNP  ? ? ?  Family History ?Family History  ?Problem Relation Age of Onset  ? Healthy Mother   ? Anxiety disorder Mother   ? Healthy Father   ? Depression Sister   ? Anxiety disorder Sister   ? ? ?Social History ?Social History  ? ?Tobacco Use  ? Smoking status: Never  ? Smokeless tobacco: Never  ?Vaping Use  ? Vaping Use: Never used  ?Substance Use Topics  ? Alcohol use: Yes  ?  Comment: socially  ? Drug use: Yes  ?  Types: Marijuana  ?  Comment: socially  ? ? ? ?Allergies   ?Banana and Rizatriptan ? ? ?Review of Systems ?Review of Systems  ?Constitutional:  Positive for chills and fever. Negative for activity change and appetite change.  ?Cardiovascular: Negative.    ?Gastrointestinal: Negative.   ?Genitourinary:  Positive for dysuria, flank pain, frequency, hematuria and urgency. Negative for decreased urine volume, vaginal bleeding, vaginal discharge and vaginal pain.  ?Skin: Negative.   ?Psychiatric/Behavioral: Negative.    ? ? ?Physical Exam ?Triage Vital Signs ?ED Triage Vitals  ?Enc Vitals Group  ?   BP 08/03/21 1730 103/70  ?   Pulse Rate 08/03/21 1730 85  ?   Resp 08/03/21 1730 18  ?   Temp 08/03/21 1730 99 ?F (37.2 ?C)  ?   Temp Source 08/03/21 1730 Oral  ?   SpO2 08/03/21 1730 98 %  ?   Weight --   ?   Height --   ?   Head Circumference --   ?   Peak Flow --   ?   Pain Score 08/03/21 1729 7  ?   Pain Loc --   ?   Pain Edu? --   ?   Excl. in GC? --   ? ?No data found. ? ?Updated Vital Signs ?BP 103/70 (BP Location: Left Arm)   Pulse 85   Temp 99 ?F (37.2 ?C) (Oral)   Resp 18   LMP 07/20/2021   SpO2 98%  ? ?Visual Acuity ?Right Eye Distance:   ?Left Eye Distance:   ?Bilateral Distance:   ? ?Right Eye Near:   ?Left Eye Near:    ?Bilateral Near:    ? ?Physical Exam ?Vitals reviewed.  ?Constitutional:   ?   General: She is not in acute distress. ?   Appearance: Normal appearance.  ?HENT:  ?   Head: Normocephalic and atraumatic.  ?Cardiovascular:  ?   Rate and Rhythm: Normal rate and regular rhythm.  ?Pulmonary:  ?   Effort: Pulmonary effort is normal.  ?   Breath sounds: Normal breath sounds.  ?Abdominal:  ?   General: Bowel sounds are normal.  ?   Palpations: Abdomen is soft.  ?   Tenderness: There is right CVA tenderness and left CVA tenderness.  ?   Comments: + suprapubic tenderness  ?Skin: ?   General: Skin is warm and dry.  ?   Capillary Refill: Capillary refill takes less than 2 seconds.  ?Neurological:  ?   Mental Status: She is alert and oriented to person, place, and time.  ?Psychiatric:     ?   Mood and Affect: Mood normal.     ?   Behavior: Behavior normal.  ? ? ? ?UC Treatments / Results  ?Labs ?(all labs ordered are listed, but only abnormal results are  displayed) ?Labs Reviewed  ?POCT URINALYSIS DIPSTICK, ED / UC - Abnormal; Notable for the following components:  ?    Result Value  ?  Ketones, ur TRACE (*)   ? Hgb urine dipstick LARGE (*)   ? Protein, ur 100 (*)   ? Leukocytes,Ua MODERATE (*)   ? All other components within normal limits  ?URINE CULTURE  ?POC URINE PREG, ED  ? ? ?EKG ? ? ?Radiology ?No results found. ? ?Procedures ?Procedures (including critical care time) ? ?Medications Ordered in UC ?Medications - No data to display ? ?Initial Impression / Assessment and Plan / UC Course  ?I have reviewed the triage vital signs and the nursing notes. ? ?Pertinent labs & imaging results that were available during my care of the patient were reviewed by me and considered in my medical decision making (see chart for details). ? ?The patient is a 26 year old female who presents with symptoms of UTI that started this morning.  Her vitals are stable at this time, but she informs that she had a fever greater than 100 earlier today.  On exam, she has bilateral flank pain and CVA tenderness.  Will cover the patient with Cipro for 7 days.  This will cover patient for cystitis and possible pyelonephritis.  Patient encouraged to increase fluids, recommending that she drink at least 64 ounces of water daily.  Ibuprofen or Tylenol for pain, fever, or general discomfort.  Also instructed patient to begin voiding at least 15 to 20 minutes after any sexual encounter.  A urine culture will also be ordered to ensure patient is covered with the appropriate antibiotic.  Patient encouraged to follow-up for worsening fever, chills, abdominal pain, or other concerns.  Patient will be contacted if her urinalysis warrants a change in her medication therapy. ?Final Clinical Impressions(s) / UC Diagnoses  ? ?Final diagnoses:  ?Cystitis  ? ? ? ?Discharge Instructions   ? ?  ?Urinalysis shows a large amount of blood, and moderate leukocytes.  I am going to order a urine culture to ensure you  are being treated with the appropriate antibiotic.  If there needs to be a change in your therapy, you will be contacted. ?Take medication as prescribed. ?YOU should be drinking at least 64 ounces of water daily

## 2021-08-03 NOTE — ED Triage Notes (Signed)
Pt reports dysuria and frequency of urination that started today. Reports last time urinated before coming in to be seen noticed blood in urine.  ?

## 2021-08-03 NOTE — Discharge Instructions (Addendum)
Urinalysis shows a large amount of blood, and moderate leukocytes.  I am going to order a urine culture to ensure you are being treated with the appropriate antibiotic.  If there needs to be a change in your therapy, you will be contacted. ?Take medication as prescribed. ?YOU should be drinking at least 64 ounces of water daily. ?We will with at least every 2 hours. You should be voiding at least 15 to 20 minutes after every sexual encounter. ?Ibuprofen or Tylenol for pain, fever, or general discomfort. ?Follow-up if you develop worsening fever, chills, abdominal pain, or other concerns. ? ?

## 2021-08-06 LAB — URINE CULTURE: Culture: >=100000 — AB

## 2021-08-07 ENCOUNTER — Telehealth (INDEPENDENT_AMBULATORY_CARE_PROVIDER_SITE_OTHER): Payer: No Typology Code available for payment source | Admitting: Nurse Practitioner

## 2021-08-07 ENCOUNTER — Encounter (HOSPITAL_BASED_OUTPATIENT_CLINIC_OR_DEPARTMENT_OTHER): Payer: Self-pay | Admitting: Nurse Practitioner

## 2021-08-07 DIAGNOSIS — Z91199 Patient's noncompliance with other medical treatment and regimen due to unspecified reason: Secondary | ICD-10-CM

## 2021-08-08 ENCOUNTER — Telehealth (HOSPITAL_BASED_OUTPATIENT_CLINIC_OR_DEPARTMENT_OTHER): Payer: Self-pay | Admitting: Physical Therapy

## 2021-08-08 ENCOUNTER — Ambulatory Visit (HOSPITAL_BASED_OUTPATIENT_CLINIC_OR_DEPARTMENT_OTHER): Payer: No Typology Code available for payment source | Attending: Nurse Practitioner | Admitting: Physical Therapy

## 2021-08-08 DIAGNOSIS — R262 Difficulty in walking, not elsewhere classified: Secondary | ICD-10-CM | POA: Insufficient documentation

## 2021-08-08 DIAGNOSIS — M25561 Pain in right knee: Secondary | ICD-10-CM | POA: Insufficient documentation

## 2021-08-08 DIAGNOSIS — M545 Low back pain, unspecified: Secondary | ICD-10-CM | POA: Insufficient documentation

## 2021-08-08 DIAGNOSIS — M6281 Muscle weakness (generalized): Secondary | ICD-10-CM | POA: Insufficient documentation

## 2021-08-08 NOTE — Telephone Encounter (Signed)
LVM regarding no show for appointment today and advised of attendance policy.  ?Uri Covey C. Ananth Fiallos PT, DPT ?08/08/21 4:29 PM ? ?

## 2021-08-10 ENCOUNTER — Ambulatory Visit (HOSPITAL_BASED_OUTPATIENT_CLINIC_OR_DEPARTMENT_OTHER): Payer: No Typology Code available for payment source | Admitting: Physical Therapy

## 2021-08-10 ENCOUNTER — Encounter (HOSPITAL_BASED_OUTPATIENT_CLINIC_OR_DEPARTMENT_OTHER): Payer: Self-pay | Admitting: Physical Therapy

## 2021-08-10 DIAGNOSIS — M25561 Pain in right knee: Secondary | ICD-10-CM | POA: Diagnosis present

## 2021-08-10 DIAGNOSIS — M6281 Muscle weakness (generalized): Secondary | ICD-10-CM | POA: Diagnosis present

## 2021-08-10 DIAGNOSIS — M545 Low back pain, unspecified: Secondary | ICD-10-CM

## 2021-08-10 DIAGNOSIS — R262 Difficulty in walking, not elsewhere classified: Secondary | ICD-10-CM

## 2021-08-10 NOTE — Therapy (Signed)
?OUTPATIENT PHYSICAL THERAPY TREATMENT NOTE ? ? ?Patient Name: Erika Durham ?MRN: [id number] ?DOB:12-12-1995, 26 y.o.,, female ?Today's Date: 08/10/2021 ? ?PCP: Tollie EthEarly, Sara E, NP ?REFERRING PROVIDER: Tollie EthEarly, Sara E, NP ? ? PT End of Session - 08/10/21 0809   ? ? Visit Number 3   ? Number of Visits 19   ? Date for PT Re-Evaluation 10/09/21   ? Authorization Type [employer]   ? PT Start Time 0801   ? PT Stop Time 0840   ? PT Time Calculation (min) 39 min   ? Activity Tolerance Patient tolerated treatment well   ? Behavior During Therapy [facility]WFL for tasks assessed/performed   ? ?  ?  ? ?  ? ? ?Past Medical History:  ?Diagnosis Date  ? Anxiety   ? Depression   ? Migraine   ? Panic attack   ? ?Past Surgical History:  ?Procedure Laterality Date  ? WISDOM TOOTH EXTRACTION    ? ?Patient Active Problem List  ? Diagnosis Date Noted  ? 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 Generalized anxiety disorder 06/18/2021  ? Chronic pain of left knee 06/18/2021  ? Hip pain, bilateral 06/18/2021  ? Establishing care with new doctor, encounter for 06/18/2021  ? MDD (major depressive disorder), severe (HCC) 08/14/2020  ? ? ?REFERRING DIAG: M24.80 (ICD-10-CM) - Generalized hypermobility of joints ? ?THERAPY DIAG:  ?Pain, lumbar region ? ?Muscle weakness (generalized) ? ?Pain in joint of right knee ? ?Difficulty walking ? ?PERTINENT HISTORY: anxiety, depression, patellar dislocation x7 ? ?PRECAUTIONS: none ? ?SUBJECTIVE: Pt reports that she hit her Rt knee on night stand on Sunday and dislocated her Rt knee cap; manually put it back with hands.   She states she uses WC 3-4x/wk at work and to get around on campus due to long walks.  ? ?PAIN:  ?Are you having pain? Yes: NPRS scale: 5/10 ?Pain location: Rt knee and low back ?Pain description: tired, sore ?Aggravating factors: walking ?Relieving factors: rest ? ? ?OBJECTIVE:  * All findings taken at evaluation unless otherwise noted. ?  ?DIAGNOSTIC FINDINGS: N/A ?  ?PATIENT SURVEYS:  ?FOTO  9943 ?  ?55 pts  ?6pts ?  ?COGNITION: ?         Overall cognitive status: Within functional limits for tasks assessed              ?          ?SENSATION: ?         Light touch: Appears intact ?  ?  ?MUSCLE LENGTH: ?WNL at knee and hip  ?  ?POSTURE:  ?Femoral IR and mild genu valgus in standing, standing knee hyper ext on L, WNL on R ?  ?PALPATION: ?No TTP noted in quad  ?Significant apprehension with patellar glides on L and R ?  ?LE AROM/PROM: WNL at hip and knee ?  ?LE MMT: ?  ?MMT Right ?07/11/2021 Left ?07/11/2021  ?Hip flexion 4/5 4/5  ?Hip extension      ?Hip abduction 4/5 4/5  ?Hip adduction 4/5 4/5  ?Knee flexion 4/5 4/5  ?Knee extension 4/5 4/5  ? (Blank rows = not tested) ?  ?LOWER EXTREMITY SPECIAL TESTS:  ?Knee special tests: Patellafemoral apprehension test: positive and Lateral pull sign: positive ?  ?Beighton Score: 5 / 9 = 55.6 % ?  ?FUNCTIONAL TESTS:  ?Squat: diminished depth, quad dominant motion, pain/apprehension at depth ?  ?GAIT: ?Distance walked: 3825ft ?Assistive device utilized: Single point cane ?Level of assistance: Complete Independence ?Comments:  WFL, slight toe out position ?  ?  ?  ?TODAY'S TREATMENT: ? 08/10/21: ?Pt seen for aquatic therapy today.  Treatment took place in water 3.25-4.8 ft in depth at the [facility] pool. Temp of water was 92?.  Pt entered/exited the pool via stairs independently with bilat rail. ?Warm up: walking forward, backward, side stepping 3-4laps of each  ?-heel/toe raises x 10 x 2 ?-squats x 10 x 2 ?-hip abdct/add x 10 x 2 ea ?- hip ext x 10 x 2 ea ?- bicycle on yellow noodle x 2 min;  scissors x 10 ?- Kick board push downs (vertical position, 1/2 submerged) x 5 sec x 10 reps; then submerged with push in/out (row)  - core activation ?-plank on bench, with hip ext x 5 ea ?- plank on yellow noodle x 10 sec - too difficult ?- forward walking  ? ?Pt requires buoyancy for support and to offload joints with strengthening exercises. Viscosity of the water is needed  for resistance of strengthening; water current perturbations provides challenge to standing balance unsupported, requiring increased core activation. ? ? ?3/30: ?Walk to/from pool with education regarding aquatics ?Bike 5 min L2 ?Sit<>stand ball bw knees, no UE assist x15 ?Heel raises & gastroc stretch ?Standing hip abd in slight turnout ?Plank- hands on mat with heel raises ?MANUAL: ktape general for bil patella with education for self applications ?  ?PATIENT EDUCATION:  ?Education details: exercise form/rationale, aquatics ?Person educated: Patient ?Education method: Explanation, Demonstration, Tactile cues, Verbal cues, and Handouts ?Education comprehension: verbalized understanding, returned demonstration, verbal cues required, and tactile cues required ?  ?  ?HOME EXERCISE PROGRAM: ?Access Code: [email or web address] ?URL: [email or web address] ? ?  ?ASSESSMENT: ?  ?CLINICAL IMPRESSION: ?Pt reported increased Lt knee discomfort with backwards walking, and tolerated side stepping with increased step height better than straight legs.  Overall tolerated aquatic exercises well, with only increase in fatigue not pain.   Pt is confident in aquatic environment and can take direction from therapist on deck.  Goals are ongoing.  ?  ?  ?OBJECTIVE IMPAIRMENTS Abnormal gait, decreased activity tolerance, decreased balance, decreased mobility, difficulty walking, decreased ROM, decreased strength, increased muscle spasms, improper body mechanics, postural dysfunction, pain, and hypermobility ?  ?ACTIVITY LIMITATIONS cleaning, community activity, driving, occupation, Pharmacologist, yard work, shopping, and Interior and spatial designer .  ?  ?PERSONAL FACTORS Behavior pattern, Fitness, Time since onset of injury/illness/exacerbation, and 1-2 comorbidities:    are also affecting patient's functional outcome.  ?  ?  ?REHAB POTENTIAL: Fair   ?  ?CLINICAL DECISION MAKING: Evolving/moderate complexity ?  ?EVALUATION COMPLEXITY: Moderate ?  ?   ?GOALS: ?  ?  ?SHORT TERM GOALS: ?  ?Pt will become independent with HEP in order to demonstrate synthesis of PT education. ?  ?  ?Target date: 08/22/2021 ?Goal status: INITIAL ?  ?2.  Pt will report at least 2 pt reduction on NPRS scale for pain during squatting/bending in order to demonstrate functional improvement with household activity, self care, and ADL.  ?  ?Target date: 08/22/2021 ?Goal status: INITIAL ?  ?3.  Pt will score at least 6 pt increase on FOTO to demonstrate functional improvement in MCII and pt perceived function.   ?  ?Target date: 08/22/2021 ?Goal status: INITIAL ?  ?LONG TERM GOALS: ?  ?Pt  will become independent with final HEP in order to demonstrate synthesis of PT education. ?  ?  ?Target date: 10/03/2021 ?Goal status: INITIAL ?  ?2.  Pt will be able to  demonstrate step/up down without UE support or pain in order to demonstrate functional improvement in LE function for self-care and house hold duties. ?  ?  ?Target date: 10/03/2021 ?Goal status: INITIAL ?  ?3.   ?  ?Target date: 10/03/2021 ?Goal status: INITIAL ?  ?4.  Pt will score >/= 43 on FOTO to demonstrate improvement in perceived general function and mobility.  ?  ?Target date: 10/03/2021 ?Goal status: INITIAL ?  ?  ?PLAN: ?PT FREQUENCY: 1-2x/week ?  ?PT DURATION: 12 weeks (likely D/C by 10 wks) ?  ?PLANNED INTERVENTIONS: Therapeutic exercises, Therapeutic activity, Neuromuscular re-education, Balance training, Gait training, Patient/Family education, Joint manipulation, Joint mobilization, Stair training, Prosthetic training, DME instructions, Aquatic Therapy, Dry Needling, Electrical stimulation, Spinal manipulation, Spinal mobilization, Cryotherapy, Moist heat, Splintting, Taping, Vasopneumatic device, Traction, Ultrasound, Ionotophoresis 4mg /ml Dexamethasone, and Manual therapy ?  ?PLAN FOR NEXT SESSION: continue aquatic therapy- starting with central lumbopelvic stabilization ?  ? , PTA ?08/10/21 8:46 AM ? ? ?    ?

## 2021-08-11 ENCOUNTER — Encounter (HOSPITAL_BASED_OUTPATIENT_CLINIC_OR_DEPARTMENT_OTHER): Payer: Self-pay | Admitting: Nurse Practitioner

## 2021-08-11 ENCOUNTER — Other Ambulatory Visit (HOSPITAL_BASED_OUTPATIENT_CLINIC_OR_DEPARTMENT_OTHER): Payer: Self-pay | Admitting: Nurse Practitioner

## 2021-08-11 DIAGNOSIS — N3090 Cystitis, unspecified without hematuria: Secondary | ICD-10-CM

## 2021-08-11 MED ORDER — CIPROFLOXACIN HCL 500 MG PO TABS: 500.0000 mg | ORAL_TABLET | Freq: Two times a day (BID) | ORAL | 0 refills | Status: AC

## 2021-08-11 MED ORDER — FLUCONAZOLE 150 MG PO TABS: 150.0000 mg | ORAL_TABLET | Freq: Once | ORAL | 1 refills | Status: AC

## 2021-08-12 NOTE — Progress Notes (Signed)
Original appointment missed. [facility] f/u at later time.  ?

## 2021-08-22 ENCOUNTER — Encounter (HOSPITAL_BASED_OUTPATIENT_CLINIC_OR_DEPARTMENT_OTHER): Payer: Self-pay

## 2021-08-22 ENCOUNTER — Ambulatory Visit (HOSPITAL_BASED_OUTPATIENT_CLINIC_OR_DEPARTMENT_OTHER): Payer: No Typology Code available for payment source | Admitting: Physical Therapy

## 2021-08-22 ENCOUNTER — Telehealth (HOSPITAL_BASED_OUTPATIENT_CLINIC_OR_DEPARTMENT_OTHER): Payer: Self-pay | Admitting: Physical Therapy

## 2021-08-22 NOTE — Telephone Encounter (Signed)
Patient did not show for PT appointment.  Called patient and left HIPAA compliant message regarding missed appointment and requesting patient return phone call to confirm next appointment.   ?([phone] ?Kerin Perna, PTA ?08/22/21 10:06 AM ? ? ? ?

## 2021-08-24 ENCOUNTER — Ambulatory Visit (HOSPITAL_BASED_OUTPATIENT_CLINIC_OR_DEPARTMENT_OTHER): Payer: No Typology Code available for payment source | Admitting: Physical Therapy

## 2021-08-24 NOTE — Therapy (Incomplete)
?OUTPATIENT PHYSICAL THERAPY TREATMENT NOTE ? ? ?Patient Name: Erika Durham ?MRN: [id number] ?DOB:08-24-1995, 26 y.o., female ?Today's Date: 08/24/2021 ? ?PCP: Orma Render, NP ?REFERRING PROVIDER: Orma Render, NP ? ? ? ? ?Past Medical History:  ?Diagnosis Date  ? Anxiety   ? Depression   ? Migraine   ? Panic attack   ? ?Past Surgical History:  ?Procedure Laterality Date  ? WISDOM TOOTH EXTRACTION    ? ?Patient Active Problem List  ? Diagnosis Date Noted  ? 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 Generalized anxiety disorder 06/18/2021  ? Chronic pain of left knee 06/18/2021  ? Hip pain, bilateral 06/18/2021  ? Establishing care with new doctor, encounter for 06/18/2021  ? MDD (major depressive disorder), severe ([place]) 08/14/2020  ? ? ?REFERRING DIAG: M24.80 (ICD-10-CM) - Generalized hypermobility of joints ? ?THERAPY DIAG:  ?No diagnosis found. ? ?PERTINENT HISTORY: anxiety, depression, patellar dislocation x7 ? ?PRECAUTIONS: none ? ?SUBJECTIVE: Pt reports that she hit her Rt knee on night stand on Sunday and dislocated her Rt knee cap; manually put it back with hands.   She states she uses WC 3-4x/wk at work and to get around on campus due to long walks.  ? ?PAIN:  ?Are you having pain? Yes: NPRS scale: 5/10 ?Pain location: Rt knee and low back ?Pain description: tired, sore ?Aggravating factors: walking ?Relieving factors: rest ? ? ?OBJECTIVE:  * All findings taken at evaluation unless otherwise noted. ?  ?DIAGNOSTIC FINDINGS: N/A ?  ?PATIENT SURVEYS:  ?FOTO 42 ?  ?55 pts  ?6pts ?  ?COGNITION: ?         Overall cognitive status: Within functional limits for tasks assessed              ?          ?SENSATION: ?         Light touch: Appears intact ?  ?  ?MUSCLE LENGTH: ?WNL at knee and hip  ?  ?POSTURE:  ?Femoral IR and mild genu valgus in standing, standing knee hyper ext on L, WNL on R ?  ?PALPATION: ?No TTP noted in quad  ?Significant apprehension with patellar glides on L and R ?  ?LE AROM/PROM:  WNL at hip and knee ?  ?LE MMT: ?  ?MMT Right ?07/11/2021 Left ?07/11/2021  ?Hip flexion 4/5 4/5  ?Hip extension      ?Hip abduction 4/5 4/5  ?Hip adduction 4/5 4/5  ?Knee flexion 4/5 4/5  ?Knee extension 4/5 4/5  ? (Blank rows = not tested) ?  ?LOWER EXTREMITY SPECIAL TESTS:  ?Knee special tests: Patellafemoral apprehension test: positive and Lateral pull sign: positive ?  ?Beighton Score: 5 / 9 = 55.6 % ?  ?FUNCTIONAL TESTS:  ?Squat: diminished depth, quad dominant motion, pain/apprehension at depth ?  ?GAIT: ?Distance walked: 37ft ?Assistive device utilized: Single point cane ?Level of assistance: Complete Independence ?Comments: WFL, slight toe out position ?  ?  ?  ?TODAY'S TREATMENT: ? 08/10/21: ?Pt seen for aquatic therapy today.  Treatment took place in water 3.25-4.8 ft in depth at the [facility] pool. Temp of water was 92?.  Pt entered/exited the pool via stairs independently with bilat rail. ?Warm up: walking forward, backward, side stepping 3-4laps of each  ?-heel/toe raises x 10 x 2 ?-squats x 10 x 2 ?-hip abdct/add x 10 x 2 ea ?- hip ext x 10 x 2 ea ?- bicycle on yellow noodle x 2 min;  scissors x 10 ?-  Kick board push downs (vertical position, 1/2 submerged) x 5 sec x 10 reps; then submerged with push in/out (row)  - core activation ?-plank on bench, with hip ext x 5 ea ?- plank on yellow noodle x 10 sec - too difficult ?- forward walking  ? ?Pt requires buoyancy for support and to offload joints with strengthening exercises. Viscosity of the water is needed for resistance of strengthening; water current perturbations provides challenge to standing balance unsupported, requiring increased core activation. ? ? ?3/30: ?Walk to/from pool with education regarding aquatics ?Bike 5 min L2 ?Sit<>stand ball bw knees, no UE assist x15 ?Heel raises & gastroc stretch ?Standing hip abd in slight turnout ?Plank- hands on mat with heel raises ?MANUAL: ktape general for bil patella with education for self  applications ?  ?PATIENT EDUCATION:  ?Education details: exercise form/rationale, aquatics ?Person educated: Patient ?Education method: Explanation, Demonstration, Tactile cues, Verbal cues, and Handouts ?Education comprehension: verbalized understanding, returned demonstration, verbal cues required, and tactile cues required ?  ?  ?HOME EXERCISE PROGRAM: ?Access Code: [email or web address] ?URL: [email or web address] ? ?  ?ASSESSMENT: ?  ?CLINICAL IMPRESSION: ?Pt reported increased Lt knee discomfort with backwards walking, and tolerated side stepping with increased step height better than straight legs.  Overall tolerated aquatic exercises well, with only increase in fatigue not pain.   Pt is confident in aquatic environment and can take direction from therapist on deck.  Goals are ongoing.  ?  ?  ?OBJECTIVE IMPAIRMENTS Abnormal gait, decreased activity tolerance, decreased balance, decreased mobility, difficulty walking, decreased ROM, decreased strength, increased muscle spasms, improper body mechanics, postural dysfunction, pain, and hypermobility ?  ?ACTIVITY LIMITATIONS cleaning, community activity, driving, occupation, Medical sales representative, yard work, shopping, and Naval architect .  ?  ?PERSONAL FACTORS Behavior pattern, Fitness, Time since onset of injury/illness/exacerbation, and 1-2 comorbidities:    are also affecting patient's functional outcome.  ?  ?  ?REHAB POTENTIAL: Fair   ?  ?CLINICAL DECISION MAKING: Evolving/moderate complexity ?  ?EVALUATION COMPLEXITY: Moderate ?  ?  ?GOALS: ?  ?  ?SHORT TERM GOALS: ?  ?Pt will become independent with HEP in order to demonstrate synthesis of PT education. ?  ?  ?Target date: 08/22/2021 ?Goal status: INITIAL ?  ?2.  Pt will report at least 2 pt reduction on NPRS scale for pain during squatting/bending in order to demonstrate functional improvement with household activity, self care, and ADL.  ?  ?Target date: 08/22/2021 ?Goal status: INITIAL ?  ?3.  Pt will score at  least 6 pt increase on FOTO to demonstrate functional improvement in MCII and pt perceived function.   ?  ?Target date: 08/22/2021 ?Goal status: INITIAL ?  ?LONG TERM GOALS: ?  ?Pt  will become independent with final HEP in order to demonstrate synthesis of PT education. ?  ?  ?Target date: 10/03/2021 ?Goal status: INITIAL ?  ?2.  Pt will be able to demonstrate step/up down without UE support or pain in order to demonstrate functional improvement in LE function for self-care and house hold duties. ?  ?  ?Target date: 10/03/2021 ?Goal status: INITIAL ?  ?3.   ?  ?Target date: 10/03/2021 ?Goal status: INITIAL ?  ?4.  Pt will score >/= 43 on FOTO to demonstrate improvement in perceived general function and mobility.  ?  ?Target date: 10/03/2021 ?Goal status: INITIAL ?  ?  ?PLAN: ?PT FREQUENCY: 1-2x/week ?  ?PT DURATION: 12 weeks (likely D/C by 10 wks) ?  ?PLANNED INTERVENTIONS: Therapeutic exercises, Therapeutic activity,  Neuromuscular re-education, Balance training, Gait training, Patient/Family education, Joint manipulation, Joint mobilization, Stair training, Prosthetic training, DME instructions, Aquatic Therapy, Dry Needling, Electrical stimulation, Spinal manipulation, Spinal mobilization, Cryotherapy, Moist heat, Splintting, Taping, Vasopneumatic device, Traction, Ultrasound, Ionotophoresis 4mg /ml Dexamethasone, and Manual therapy ?  ?PLAN FOR NEXT SESSION: continue aquatic therapy- starting with central lumbopelvic stabilization ?  ?Kerin Perna, PTA ?08/24/21 8:48 AM ? ? ?   ?

## 2021-08-30 ENCOUNTER — Ambulatory Visit (HOSPITAL_BASED_OUTPATIENT_CLINIC_OR_DEPARTMENT_OTHER): Payer: No Typology Code available for payment source | Admitting: Physical Therapy

## 2021-08-30 ENCOUNTER — Encounter (HOSPITAL_BASED_OUTPATIENT_CLINIC_OR_DEPARTMENT_OTHER): Payer: Self-pay | Admitting: Physical Therapy

## 2021-08-30 DIAGNOSIS — M25561 Pain in right knee: Secondary | ICD-10-CM

## 2021-08-30 DIAGNOSIS — M545 Low back pain, unspecified: Secondary | ICD-10-CM | POA: Diagnosis not present

## 2021-08-30 DIAGNOSIS — M6281 Muscle weakness (generalized): Secondary | ICD-10-CM

## 2021-08-30 DIAGNOSIS — R262 Difficulty in walking, not elsewhere classified: Secondary | ICD-10-CM

## 2021-08-30 NOTE — Therapy (Addendum)
PHYSICAL THERAPY DISCHARGE SUMMARY  Visits from Start of Care: 4  Current functional level related to goals / functional outcomes: No changes   Remaining deficits: Lumbar pain   Education / Equipment: Management of condition   Patient agrees to discharge. Patient goals were not met. Patient is being discharged due to not returning since the last visit.  Addend Corrie Dandy Tomma Lightning) Ziemba MPT 10/11/22 345p  OUTPATIENT PHYSICAL THERAPY TREATMENT NOTE   Patient Name: Erika Durham MRN: [id number] DOB:1995-08-12, 26 y.o., female Today's Date: 08/30/2021  PCP: Tollie Eth, NP REFERRING PROVIDER: Tollie Eth, NP   PT End of Session - 08/30/21 0848     Visit Number 4    Number of Visits 19    Date for PT Re-Evaluation 10/09/21    Authorization Type [employer]    PT Start Time 0845    PT Stop Time 0925    PT Time Calculation (min) 40 min             Past Medical History:  Diagnosis Date   Anxiety    Depression    Migraine    Panic attack    Past Surgical History:  Procedure Laterality Date   WISDOM TOOTH EXTRACTION     Patient Active Problem List   Diagnosis Date Noted   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  She states she was "super sore" after last session, but she also went out to bars with friends that night.  Was sore for 2 days.   She has been using cane more  than her wheelchair lately.   PAIN:  Are you having pain? Yes: NPRS scale: 3/10 Pain location: low back Pain description: tired, sore Aggravating factors: walking Relieving factors: epsom salt baths   OBJECTIVE:  * All findings taken at evaluation unless otherwise noted.   DIAGNOSTIC FINDINGS: N/A   PATIENT SURVEYS:  FOTO 43   55 pts  6pts   COGNITION:          Overall cognitive status: Within functional limits for tasks assessed                        SENSATION:          Light touch: Appears intact     MUSCLE LENGTH: WNL at knee and hip    POSTURE:  Femoral IR and mild genu valgus in standing, standing knee hyper ext on L, WNL on R   PALPATION: No TTP noted in quad  Significant apprehension with patellar glides on L and R   LE AROM/PROM: WNL at hip and knee   LE MMT:   MMT Right 07/11/2021 Left 07/11/2021  Hip flexion 4/5 4/5  Hip extension      Hip abduction 4/5 4/5  Hip adduction 4/5 4/5  Knee flexion 4/5 4/5  Knee extension 4/5 4/5   (Blank rows = not tested)   LOWER EXTREMITY SPECIAL TESTS:  Knee special tests: Patellafemoral apprehension test: positive and Lateral pull sign: positive   Beighton Score: 5 / 9 = 55.6 %   FUNCTIONAL TESTS:  Squat: diminished depth, quad dominant motion, pain/apprehension at depth   GAIT: Distance walked: 67ft Assistive device utilized: Single point cane Level of assistance: Complete Independence Comments: WFL, slight toe out position       TODAY'S TREATMENT:  08/30/21: Pt seen for aquatic therapy today.  Treatment took place in water 3.25-4.8 ft in depth at the [facility] pool. Temp of water was 92.  Pt entered/exited the pool via stairs independently with bilat rail. Warm up: walking forward, backward, side stepping 3-4laps of each  -heel/toe raises x 10 x 2 -squats x 10 x 2 -3 way hip without support x 5 each leg x 2 sets, added yellow hand buoys during exercise for added support - bicycle on yellow  noodle x 2 min;  cross country skii; scissors x 10 - Kick board push downs (vertical position, 1/2 submerged ) with push in/out (row) 15 - core activation -seated balance on yellow noodle;  hands pushing noodle down for core engagement;  single arm lifts x 5 each - mini forward walking lunges - 1 lap - forward / backward walking in between exercises for rest/ recovery Supported supine float last 3 min (no change in back pain)   Pt requires buoyancy for support and to offload joints with strengthening exercises. Viscosity of the water is needed for resistance of strengthening; water current perturbations provides challenge to standing balance unsupported, requiring increased core activation.    PATIENT EDUCATION:  Education details: exercise form/rationale, aquatics Person educated: Patient Education method: Explanation, Facilities manager, Actor cues, Verbal cues, and Handouts Education comprehension: verbalized understanding, returned demonstration, verbal cues required, and tactile cues required     HOME EXERCISE PROGRAM: Access Code: [email or web address] URL: [email or web address]    ASSESSMENT:   CLINICAL IMPRESSION: Pt reported less Lt knee discomfort with side stepping and backwards walking than last session.  Back pain remained unchanged.  Pt reported leg fatigue after 35 min; encouraged pt to dial back intensity of exercises throughout session to avoid over exertion. She is reporting partial compliance to HEP, and gradual improvement in mobility with less use of WC.  Goals are ongoing.      OBJECTIVE IMPAIRMENTS Abnormal gait, decreased activity tolerance, decreased balance, decreased mobility, difficulty walking, decreased ROM, decreased strength, increased muscle spasms, improper body mechanics, postural dysfunction, pain, and hypermobility   ACTIVITY LIMITATIONS cleaning, community activity, driving, occupation, Pharmacologist, yard work, shopping, and Interior and spatial designer .     PERSONAL FACTORS Behavior pattern, Fitness, Time since onset of injury/illness/exacerbation, and 1-2 comorbidities:    are also affecting patient's functional outcome.      REHAB POTENTIAL: Fair     CLINICAL DECISION MAKING: Evolving/moderate complexity   EVALUATION COMPLEXITY: Moderate     GOALS:     SHORT TERM GOALS:   Pt will become independent with HEP in order to demonstrate synthesis of PT education.   Target date: 08/22/2021 Goal status: Ongoing    2.  Pt will report at least 2 pt reduction on NPRS scale for pain during squatting/bending in order to demonstrate functional improvement with household activity, self care, and ADL.    Target date: 08/22/2021 Goal status: Ongoing   3.  Pt will score at least 6 pt increase on FOTO to demonstrate functional improvement in MCII and pt perceived function.     Target date: 08/22/2021 Goal status: Ongoing  LONG TERM GOALS:   Pt  will become independent with final HEP in order to demonstrate synthesis of PT education.     Target date: 10/03/2021 Goal status: INITIAL   2.  Pt will be able to demonstrate step/up down without UE support or pain in order to demonstrate functional improvement in LE function for self-care and house hold duties.     Target date: 10/03/2021 Goal status: INITIAL   3.     Target date: 10/03/2021 Goal status: INITIAL   4.  Pt will score >/= 43 on FOTO to demonstrate improvement in perceived general function and mobility.    Target date: 10/03/2021 Goal status: INITIAL     PLAN: PT FREQUENCY: 1-2x/week   PT DURATION: 12 weeks (likely D/C by 10 wks)   PLANNED INTERVENTIONS: Therapeutic exercises, Therapeutic activity, Neuromuscular re-education, Balance training, Gait training, Patient/Family education, Joint manipulation, Joint mobilization, Stair training, Prosthetic training, DME instructions, Aquatic Therapy, Dry Needling, Electrical stimulation, Spinal manipulation, Spinal mobilization,  Cryotherapy, Moist heat, Splintting, Taping, Vasopneumatic device, Traction, Ultrasound, Ionotophoresis 4mg /ml Dexamethasone, and Manual therapy   PLAN FOR NEXT SESSION: continue aquatic therapy- starting with central lumbopelvic stabilization   Mayer Camel, PTA 08/30/21 10:30 AM

## 2021-09-03 ENCOUNTER — Encounter (HOSPITAL_BASED_OUTPATIENT_CLINIC_OR_DEPARTMENT_OTHER): Payer: Self-pay | Admitting: Nurse Practitioner

## 2021-09-03 ENCOUNTER — Telehealth (INDEPENDENT_AMBULATORY_CARE_PROVIDER_SITE_OTHER): Payer: No Typology Code available for payment source | Admitting: Nurse Practitioner

## 2021-09-03 DIAGNOSIS — F322 Major depressive disorder, single episode, severe without psychotic features: Secondary | ICD-10-CM | POA: Diagnosis not present

## 2021-09-03 DIAGNOSIS — M25562 Pain in left knee: Secondary | ICD-10-CM

## 2021-09-03 DIAGNOSIS — M25551 Pain in right hip: Secondary | ICD-10-CM

## 2021-09-03 DIAGNOSIS — F411 Generalized anxiety disorder: Secondary | ICD-10-CM

## 2021-09-03 DIAGNOSIS — M25552 Pain in left hip: Secondary | ICD-10-CM

## 2021-09-03 DIAGNOSIS — G8929 Other chronic pain: Secondary | ICD-10-CM

## 2021-09-03 MED ORDER — MELOXICAM 15 MG PO TABS: 15.0000 mg | ORAL_TABLET | Freq: Every day | ORAL | 3 refills | Status: DC

## 2021-09-03 MED ORDER — CLONAZEPAM 0.5 MG PO TABS: 0.2500 mg | ORAL_TABLET | Freq: Every day | ORAL | 1 refills | Status: AC | PRN

## 2021-09-03 MED ORDER — ESCITALOPRAM OXALATE 20 MG PO TABS: 20.0000 mg | ORAL_TABLET | Freq: Every day | ORAL | 3 refills | Status: DC

## 2021-09-03 MED ORDER — CYCLOBENZAPRINE HCL 5 MG PO TABS: 5.0000 mg | ORAL_TABLET | Freq: Three times a day (TID) | ORAL | 3 refills | Status: AC | PRN

## 2021-09-03 MED ORDER — PROPRANOLOL HCL 20 MG PO TABS: 20.0000 mg | ORAL_TABLET | Freq: Two times a day (BID) | ORAL | 3 refills | Status: DC

## 2021-09-03 MED ORDER — GABAPENTIN 100 MG PO CAPS: 100.0000 mg | ORAL_CAPSULE | Freq: Three times a day (TID) | ORAL | 3 refills | Status: AC

## 2021-09-03 NOTE — Assessment & Plan Note (Signed)
Chronic. Doing well with current medication regimen. Recently ran out of medications so symptoms are exacerbated at this time. Will send refill on all meds to day to get her restarted. Recommend close monitoring for new or worsening symptoms and f/u immediately if these present.  ?

## 2021-09-03 NOTE — Progress Notes (Signed)
Virtual Visit Encounter  visit. ? ? ?I connected with  Erika Durham on 09/03/21 at  1:10 PM EDT by secure video and audio telemedicine application. I verified that I am speaking with the correct person using two identifiers. ?  ?I introduced myself as a Publishing rights manager with the practice. The limitations of evaluation and management by telemedicine discussed with the patient and the availability of in person appointments. The patient expressed verbal understanding and consent to proceed. ? ?Participating parties in this visit include: Myself and patient ? ?The patient is: Patient Location: Home ?I am: Provider Location: Office/Clinic ?Subjective:   ? ?CC and HPI: Erika Durham is a 26 y.o. year old female presenting for follow up of chronic joint pain, depression, and anxiety. ? ?She has been taking meloxicam and flexeril for her chronic joint pain, which has been helpful in conjunction with PT. She is typically taking 2 flexeril a day, but on really bad days will take a 3rd. She feels that PT is going well and is helpful. She has no SE of the medications.  ? ?She is taking lexapro, gabapentin, propranolol, and clonazepam for her mood. She ran out of medications about a week ago and has had exacerbation of her mood symptoms, but she is confident that once she starts back on her medications she will feel better. She just needs refills of these today. She is taking the clonazepam only as needed and not daily. She has no SI/HI at this time.  ? ?Past medical history, Surgical history, Family history not pertinant except as noted below, Social history, Allergies, and medications have been entered into the medical record, reviewed, and corrections made.  ? ?Review of Systems:  ?All review of systems negative except what is listed in the HPI ? ?Objective:   ? ?Alert and oriented x 4 ?Speaking in clear sentences with no shortness of breath. ?No distress. ? ?Impression and Recommendations:   ? ?Problem List Items  Addressed This Visit   ? ? Generalized anxiety disorder  ?  Chronic. Doing well with current medication regimen. Recently ran out of medications so symptoms are exacerbated at this time. Will send refill on all meds to day to get her restarted. Recommend close monitoring for new or worsening symptoms and f/u immediately if these present.  ? ?  ?  ? Relevant Medications  ? escitalopram (LEXAPRO) 20 MG tablet  ? Chronic pain of left knee  ?  Doing well with PT, meloxicam, and PRN flexeril. Chronic pain is managed well with this regimen. She still does have some pain, but is able to function appropriately. Will plan to continue regimen at this time. No alarm sx present. She will f/u if her pain worsens or medication is no longer effective.  ? ?  ?  ? Relevant Medications  ? meloxicam (MOBIC) 15 MG tablet  ? gabapentin (NEURONTIN) 100 MG capsule  ? escitalopram (LEXAPRO) 20 MG tablet  ? cyclobenzaprine (FLEXERIL) 5 MG tablet  ? clonazePAM (KLONOPIN) 0.5 MG tablet  ? Hip pain, bilateral  ?  See chronic left knee pain ? ?  ?  ? Relevant Medications  ? meloxicam (MOBIC) 15 MG tablet  ? cyclobenzaprine (FLEXERIL) 5 MG tablet  ? MDD (major depressive disorder), severe ([facility]) - Primary  ?  Acute exacerbation r/t running out of medication last week. Typically well controlled with no alarm sx present today. We will refill all medications today. Plan to f/u in 6 months for anxiety medication monitoring or  sooner if symptoms worsen or fail to improve with medication restart.  ? ? ?  ?  ? Relevant Medications  ? gabapentin (NEURONTIN) 100 MG capsule  ? escitalopram (LEXAPRO) 20 MG tablet  ? propranolol (INDERAL) 20 MG tablet  ? clonazePAM (KLONOPIN) 0.5 MG tablet  ? ? ?current treatment plan is effective, no change in therapy ?I discussed the assessment and treatment plan with the patient. The patient was provided an opportunity to ask questions and all were answered. The patient agreed with the plan and demonstrated an  understanding of the instructions. ?  ?The patient was advised to call back or seek an in-person evaluation if the symptoms worsen or if the condition fails to improve as anticipated. ? ?Follow-Up: in 6 months ? ?I provided 16 minutes of non-face-to-face interaction with this non face-to-face encounter including intake, same-day documentation, and chart review.  ? ?Tollie Eth, NP , DNP, AGNP-c ?[facility] ?Primary Care & Sports Medicine at [facility] ?[phone] ?[phone] (fax) ? ?

## 2021-09-03 NOTE — Assessment & Plan Note (Signed)
Acute exacerbation r/t running out of medication last week. Typically well controlled with no alarm sx present today. We will refill all medications today. Plan to f/u in 6 months for anxiety medication monitoring or sooner if symptoms worsen or fail to improve with medication restart.  ? ?

## 2021-09-03 NOTE — Assessment & Plan Note (Signed)
See chronic left knee pain ?

## 2021-09-03 NOTE — Assessment & Plan Note (Signed)
Doing well with PT, meloxicam, and PRN flexeril. Chronic pain is managed well with this regimen. She still does have some pain, but is able to function appropriately. Will plan to continue regimen at this time. No alarm sx present. She will f/u if her pain worsens or medication is no longer effective.  ?

## 2021-09-04 ENCOUNTER — Ambulatory Visit (HOSPITAL_BASED_OUTPATIENT_CLINIC_OR_DEPARTMENT_OTHER): Payer: No Typology Code available for payment source | Attending: Nurse Practitioner | Admitting: Physical Therapy

## 2021-09-04 ENCOUNTER — Encounter (HOSPITAL_BASED_OUTPATIENT_CLINIC_OR_DEPARTMENT_OTHER): Payer: Self-pay

## 2021-09-04 ENCOUNTER — Telehealth (HOSPITAL_BASED_OUTPATIENT_CLINIC_OR_DEPARTMENT_OTHER): Payer: Self-pay | Admitting: Physical Therapy

## 2021-09-04 DIAGNOSIS — M6281 Muscle weakness (generalized): Secondary | ICD-10-CM | POA: Insufficient documentation

## 2021-09-04 DIAGNOSIS — M545 Low back pain, unspecified: Secondary | ICD-10-CM | POA: Insufficient documentation

## 2021-09-04 DIAGNOSIS — M25561 Pain in right knee: Secondary | ICD-10-CM | POA: Insufficient documentation

## 2021-09-04 DIAGNOSIS — R262 Difficulty in walking, not elsewhere classified: Secondary | ICD-10-CM | POA: Insufficient documentation

## 2021-09-04 NOTE — Telephone Encounter (Signed)
Pt with a 4th missed visit (also has 1 cx out of total of 9 visits).  Called to inform her of no show and cancellation policy but no answer on listed phone number and unable to leave message due to "mailbox being full".  Remaining scheduled visits to be canceled.  Pt will now schedule 1 visit at a time as per policy. ?

## 2021-09-07 ENCOUNTER — Ambulatory Visit (HOSPITAL_BASED_OUTPATIENT_CLINIC_OR_DEPARTMENT_OTHER): Payer: No Typology Code available for payment source | Admitting: Physical Therapy

## 2021-09-12 ENCOUNTER — Ambulatory Visit (HOSPITAL_BASED_OUTPATIENT_CLINIC_OR_DEPARTMENT_OTHER): Payer: Self-pay | Admitting: Physical Therapy

## 2021-09-14 ENCOUNTER — Ambulatory Visit (HOSPITAL_BASED_OUTPATIENT_CLINIC_OR_DEPARTMENT_OTHER): Payer: Self-pay | Admitting: Physical Therapy

## 2021-09-18 ENCOUNTER — Ambulatory Visit (HOSPITAL_BASED_OUTPATIENT_CLINIC_OR_DEPARTMENT_OTHER): Payer: Self-pay | Admitting: Physical Therapy

## 2021-09-21 ENCOUNTER — Ambulatory Visit (HOSPITAL_BASED_OUTPATIENT_CLINIC_OR_DEPARTMENT_OTHER): Payer: Self-pay | Admitting: Physical Therapy

## 2021-09-25 ENCOUNTER — Ambulatory Visit (HOSPITAL_BASED_OUTPATIENT_CLINIC_OR_DEPARTMENT_OTHER): Payer: Self-pay | Admitting: Physical Therapy

## 2021-09-27 ENCOUNTER — Ambulatory Visit (HOSPITAL_BASED_OUTPATIENT_CLINIC_OR_DEPARTMENT_OTHER): Payer: Self-pay | Admitting: Physical Therapy

## 2021-10-02 ENCOUNTER — Encounter (HOSPITAL_BASED_OUTPATIENT_CLINIC_OR_DEPARTMENT_OTHER): Payer: No Typology Code available for payment source | Admitting: Physical Therapy

## 2021-10-05 ENCOUNTER — Encounter (HOSPITAL_BASED_OUTPATIENT_CLINIC_OR_DEPARTMENT_OTHER): Payer: Self-pay | Admitting: Physical Therapy

## 2021-10-09 ENCOUNTER — Encounter (HOSPITAL_BASED_OUTPATIENT_CLINIC_OR_DEPARTMENT_OTHER): Payer: No Typology Code available for payment source | Admitting: Physical Therapy

## 2021-10-11 ENCOUNTER — Encounter (HOSPITAL_BASED_OUTPATIENT_CLINIC_OR_DEPARTMENT_OTHER): Payer: Self-pay | Admitting: Physical Therapy

## 2021-10-16 ENCOUNTER — Encounter (HOSPITAL_BASED_OUTPATIENT_CLINIC_OR_DEPARTMENT_OTHER): Payer: Self-pay | Admitting: Physical Therapy

## 2021-10-19 ENCOUNTER — Encounter (HOSPITAL_BASED_OUTPATIENT_CLINIC_OR_DEPARTMENT_OTHER): Payer: Self-pay | Admitting: Physical Therapy

## 2021-11-03 ENCOUNTER — Encounter (HOSPITAL_COMMUNITY): Payer: Self-pay | Admitting: Emergency Medicine

## 2021-11-03 ENCOUNTER — Emergency Department (HOSPITAL_COMMUNITY)
Admission: EM | Admit: 2021-11-03 | Discharge: 2021-11-04 | Disposition: A | Discharge status: Home / Self Care | Payer: No Typology Code available for payment source | Attending: Emergency Medicine | Admitting: Emergency Medicine

## 2021-11-03 ENCOUNTER — Other Ambulatory Visit: Payer: Self-pay

## 2021-11-03 DIAGNOSIS — R1084 Generalized abdominal pain: Secondary | ICD-10-CM | POA: Insufficient documentation

## 2021-11-03 DIAGNOSIS — R55 Syncope and collapse: Secondary | ICD-10-CM | POA: Diagnosis not present

## 2021-11-03 DIAGNOSIS — R112 Nausea with vomiting, unspecified: Secondary | ICD-10-CM | POA: Diagnosis present

## 2021-11-03 LAB — COMPREHENSIVE METABOLIC PANEL
ALT: 13 U/L (ref 0–44)
AST: 25 U/L (ref 15–41)
Albumin: 4.7 g/dL (ref 3.5–5.0)
Alkaline Phosphatase: 40 U/L (ref 38–126)
Anion gap: 10 (ref 5–15)
BUN: 14 mg/dL (ref 6–20)
CO2: 23 mmol/L (ref 22–32)
Calcium: 9.3 mg/dL (ref 8.9–10.3)
Chloride: 103 mmol/L (ref 98–111)
Creatinine, Ser: 0.76 mg/dL (ref 0.44–1.00)
GFR, Estimated: >60 mL/min (ref >60)
Glucose, Bld: 94 mg/dL (ref 70–99)
Potassium: 4.1 mmol/L (ref 3.5–5.1)
Sodium: 136 mmol/L (ref 135–145)
Total Bilirubin: 1.1 mg/dL (ref 0.3–1.2)
Total Protein: 8.3 g/dL — ABNORMAL HIGH (ref 6.5–8.1)

## 2021-11-03 LAB — CBC
HCT: 45.8 % (ref 36.0–46.0)
Hemoglobin: 15.8 g/dL — ABNORMAL HIGH (ref 12.0–15.0)
MCH: 30.8 pg (ref 26.0–34.0)
MCHC: 34.5 g/dL (ref 30.0–36.0)
MCV: 89.3 fL (ref 80.0–100.0)
Platelets: 337 10*3/uL (ref 150–400)
RBC: 5.13 MIL/uL — ABNORMAL HIGH (ref 3.87–5.11)
RDW: 12.8 % (ref 11.5–15.5)
WBC: 13.1 10*3/uL — ABNORMAL HIGH (ref 4.0–10.5)
nRBC: 0 % (ref 0.0–0.2)

## 2021-11-03 LAB — I-STAT BETA HCG BLOOD, ED (MC, WL, AP ONLY): I-stat hCG, quantitative: <5 m[IU]/mL (ref <5)

## 2021-11-03 LAB — LIPASE, BLOOD: Lipase: 29 U/L (ref 11–51)

## 2021-11-03 LAB — URINALYSIS, ROUTINE W REFLEX MICROSCOPIC
Bilirubin Urine: NEGATIVE
Glucose, UA: NEGATIVE mg/dL
Hgb urine dipstick: NEGATIVE
Ketones, ur: 5 mg/dL — AB
Leukocytes,Ua: NEGATIVE
Nitrite: NEGATIVE
Protein, ur: NEGATIVE mg/dL
Specific Gravity, Urine: 1.013 (ref 1.005–1.030)
pH: 6 (ref 5.0–8.0)

## 2021-11-03 MED ORDER — FAMOTIDINE IN NACL 20-0.9 MG/50ML-% IV SOLN
20.0000 mg | Freq: Once | INTRAVENOUS | Status: AC
  Administered 2021-11-03: 20 mg via INTRAVENOUS
  Filled 2021-11-03: qty 50

## 2021-11-03 MED ORDER — ONDANSETRON HCL 4 MG/2ML IJ SOLN
4.0000 mg | Freq: Once | INTRAMUSCULAR | Status: AC
  Administered 2021-11-03: 4 mg via INTRAVENOUS
  Filled 2021-11-03: qty 2

## 2021-11-03 MED ORDER — SODIUM CHLORIDE 0.9 % IV BOLUS
1000.0000 mL | Freq: Once | INTRAVENOUS | Status: AC
  Administered 2021-11-03: 1000 mL via INTRAVENOUS

## 2021-11-03 MED ORDER — METOCLOPRAMIDE HCL 5 MG/ML IJ SOLN
10.0000 mg | Freq: Once | INTRAMUSCULAR | Status: AC
  Administered 2021-11-03: 10 mg via INTRAVENOUS
  Filled 2021-11-03: qty 2

## 2021-11-03 NOTE — ED Triage Notes (Signed)
Patient complains of hematemesis that started approximately one hour ago. Patient also complains of abdominal pain. Patient is alert, oriented, ambulatory, and in no apparent distress at this time.

## 2021-11-03 NOTE — ED Provider Notes (Signed)
[facility] EMERGENCY DEPARTMENT Provider Note   CSN: [id number] Arrival date & time: 11/03/21  1746     History {Add pertinent medical, surgical, social history, OB history to HPI:1} Chief Complaint  Patient presents with   Hematemesis    Erika Durham is a 26 y.o. female present emerged department complaining of hematemesis and near syncope.  The patient reports that she has been feeling "sick all week".  Describing URI symptoms.  She reports that today she was at work about an hour ago and abruptly began nauseated and vomited.  She said there was "maybe a handful" of blood in her vomit.  She reports she has sharp epigastric pain, as well as diffuse cramping lower abdominal pain.  She denies any recent black or tarry stools.  She denies anticoagulation or blood thinner use.  She denies history of abdominal surgery.  She does report that after vomiting she became very lightheaded like she was going to pass out.  She continues to feel lightheaded in the ED.  HPI     Home Medications Prior to Admission medications   Medication Sig Start Date End Date Taking? Authorizing Provider  albuterol (VENTOLIN HFA) 108 (90 Base) MCG/ACT inhaler Inhale 2 puffs into the lungs every 6 (six) hours as needed for wheezing or shortness of breath. 04/25/21   Waldon Merl, PA-C  clonazePAM (KLONOPIN) 0.5 MG tablet Take 0.5 tablets (0.25 mg total) by mouth daily as needed for anxiety. 09/03/21   Tollie Eth, NP  cyclobenzaprine (FLEXERIL) 5 MG tablet Take 1 tablet (5 mg total) by mouth 3 (three) times daily as needed for muscle spasms. 09/03/21   Tollie Eth, NP  escitalopram (LEXAPRO) 20 MG tablet Take 1 tablet (20 mg total) by mouth daily. 09/03/21   Tollie Eth, NP  gabapentin (NEURONTIN) 100 MG capsule Take 1 capsule (100 mg total) by mouth 3 (three) times daily. 09/03/21   Tollie Eth, NP  Melatonin 3 MG CAPS Take by mouth. 09/27/20   [provider]  meloxicam (MOBIC)  15 MG tablet Take 1 tablet (15 mg total) by mouth daily. 09/03/21   Tollie Eth, NP  propranolol (INDERAL) 20 MG tablet Take 1 tablet (20 mg total) by mouth 2 (two) times daily. 09/03/21   Tollie Eth, NP      Allergies    Banana and Rizatriptan    Review of Systems   Review of Systems  Physical Exam Updated Vital Signs BP 110/86   Pulse 68   Temp 98.9 F (37.2 C) (Oral)   Resp 14   SpO2 100%  Physical Exam Constitutional:      General: She is not in acute distress. HENT:     Head: Normocephalic and atraumatic.  Eyes:     Conjunctiva/sclera: Conjunctivae normal.     Pupils: Pupils are equal, round, and reactive to light.  Cardiovascular:     Rate and Rhythm: Normal rate and regular rhythm.  Pulmonary:     Effort: Pulmonary effort is normal. No respiratory distress.  Abdominal:     General: There is no distension.     Tenderness: There is no abdominal tenderness.  Skin:    General: Skin is warm and dry.  Neurological:     General: No focal deficit present.     Mental Status: She is alert. Mental status is at baseline.  Psychiatric:        Mood and Affect: Mood normal.  Behavior: Behavior normal.     ED Results / Procedures / Treatments   Labs (all labs ordered are listed, but only abnormal results are displayed) Labs Reviewed  CBC - Abnormal; Notable for the following components:      Result Value   WBC 13.1 (*)    RBC 5.13 (*)    Hemoglobin 15.8 (*)    All other components within normal limits  LIPASE, BLOOD  COMPREHENSIVE METABOLIC PANEL  URINALYSIS, ROUTINE W REFLEX MICROSCOPIC  I-STAT BETA HCG BLOOD, ED ([facility], [facility], [facility] ONLY)    EKG EKG Interpretation  Date/Time:  Saturday November 03 2021 17:59:46 EDT Ventricular Rate:  74 PR Interval:  156 QRS Duration: 86 QT Interval:  411 QTC Calculation: 456 R Axis:   81 Text Interpretation: Sinus rhythm Confirmed by Alvester Chou [phone]) on 11/03/2021 6:19:20 PM  Radiology No results  found.  Procedures Procedures  {Document cardiac monitor, telemetry assessment procedure when appropriate:1}  Medications Ordered in ED Medications  sodium chloride 0.9 % bolus 1,000 mL (has no administration in time range)  famotidine (PEPCID) IVPB 20 mg premix (has no administration in time range)  ondansetron (ZOFRAN) injection 4 mg (has no administration in time range)    ED Course/ Medical Decision Making/ A&P                           Medical Decision Making Amount and/or Complexity of Data Reviewed Labs: ordered.  Risk Prescription drug management.   This patient presents to the ED with concern for near syncope, hematemesis. This involves an extensive number of treatment options, and is a complaint that carries with it a high risk of complications and morbidity.  The differential diagnosis includes viral gastroenteritis with possible Mallory-Weiss tear, versus peptic ulcer disease versus other.  Lightheadedness may be vasovagal or reactive to her vomiting, or another viral syndrome.  We will however check an EKG as well as electrolyte levels and hemoglobin  Her abdominal exam overall is reassuring, no guarding.  I ordered and personally interpreted labs.  The pertinent results include:  ***  I ordered imaging studies including *** I independently visualized and interpreted imaging which showed *** I agree with the radiologist interpretation  The patient was maintained on a cardiac monitor.  I personally viewed and interpreted the cardiac monitored which showed an underlying rhythm of: ***  Per my interpretation the patient's ECG shows no acute ischemic  I ordered medication including IV Zofran, IV Pepcid, IV fluid bolus for nausea and suspected gastritis.  I have reviewed the patients home medicines and have made adjustments as needed  Test Considered: ***  I requested consultation with the ***,  and discussed lab and imaging findings as well as pertinent plan -  they recommend: ***  After the interventions noted above, I reevaluated the patient and found that they have: {resolved/improved/worsened:23923::"improved"}  Social Determinants of Health:***  Dispostion:  After consideration of the diagnostic results and the patients response to treatment, I feel that the patent would benefit from ***.   {Document critical care time when appropriate:1} {Document review of labs and clinical decision tools ie heart score, Chads2Vasc2 etc:1}  {Document your independent review of radiology images, and any outside records:1} {Document your discussion with family members, caretakers, and with consultants:1} {Document social determinants of health affecting pt's care:1} {Document your decision making why or why not admission, treatments were needed:1} Final Clinical Impression(s) / ED Diagnoses Final diagnoses:  None  Rx / DC Orders ED Discharge Orders     None

## 2021-11-04 NOTE — ED Provider Notes (Signed)
  Physical Exam  BP 99/66   Pulse 81   Temp 98.9 F (37.2 C) (Oral)   Resp 15   SpO2 99%    Procedures  Procedures  ED Course / MDM   Clinical Course as of 11/04/21 0657  Sat Nov 03, 2021  2128 Patient feeling better after medications.  Will p.o. challenge. [MT]  2241 After patient a couple sips of water she began to feel extremely nauseated again.  We will try IV Reglan and again try to p.o. challenge.  If she is able to keep down fluids, she could be discharged with oral Zofran and rectal Phenergan as needed, for suspected viral gastroenteritis. [MT]  2305 N/v, pending PO challenge,  [MK]    Clinical Course User Index [MK] Greenley Martone, MD [MT] Renaye Rakers, Kermit Balo, MD   Medical Decision Making Amount and/or Complexity of Data Reviewed Labs: ordered.  Risk Prescription drug management.   Patient received in handoff.  Nausea vomiting pending p.o. challenge.  Prior to my reevaluation, the patient eloped from the emergency department.       Glendora Score, MD 11/04/21 [id number]

## 2022-01-08 ENCOUNTER — Telehealth (HOSPITAL_BASED_OUTPATIENT_CLINIC_OR_DEPARTMENT_OTHER): Payer: No Typology Code available for payment source | Admitting: Nurse Practitioner

## 2022-01-09 ENCOUNTER — Other Ambulatory Visit: Payer: Self-pay

## 2022-01-09 ENCOUNTER — Ambulatory Visit (INDEPENDENT_AMBULATORY_CARE_PROVIDER_SITE_OTHER): Payer: No Typology Code available for payment source

## 2022-01-09 ENCOUNTER — Encounter (HOSPITAL_COMMUNITY): Payer: Self-pay | Admitting: Emergency Medicine

## 2022-01-09 ENCOUNTER — Ambulatory Visit (HOSPITAL_COMMUNITY): Payer: No Typology Code available for payment source

## 2022-01-09 ENCOUNTER — Ambulatory Visit (HOSPITAL_COMMUNITY)
Admission: EM | Admit: 2022-01-09 | Discharge: 2022-01-09 | Disposition: A | Discharge status: Home / Self Care | Payer: No Typology Code available for payment source | Attending: Internal Medicine | Admitting: Internal Medicine

## 2022-01-09 DIAGNOSIS — M25571 Pain in right ankle and joints of right foot: Secondary | ICD-10-CM

## 2022-01-09 DIAGNOSIS — S93491A Sprain of other ligament of right ankle, initial encounter: Secondary | ICD-10-CM

## 2022-01-09 NOTE — ED Triage Notes (Signed)
Tripped on a stick, and then landed on ankle and heard a pop.  Patient then tried to "assess range of motion"  rotating ankle in a circle and heard another pop.  Incident occurred last night.  Pain in right ankle.  Pain and swelling in right ankle.  Patient has iced and elevated ankle throughout the night and pain is worse today.

## 2022-01-09 NOTE — Discharge Instructions (Addendum)
You have an ankle sprain X rays did not show any broken bones Rest, Ice, Compression, and Elevate your ankle Brace can be worn for extra support It might continue to swell for a few more weeks but should get better! It was nice meeting you today!

## 2022-01-09 NOTE — ED Provider Notes (Signed)
[facility] CARE CENTER   [id number] 01/09/22 Arrival Time: 1025  ASSESSMENT & PLAN:  1. Sprain of anterior talofibular ligament of right ankle, initial encounter    -History and exam consistent with a lateral ankle sprain.  X-rays were negative for fracture.  She was given a lace up ASO in the office today for extra support.  I advised her on supportive shoe wear and continuing RICE.  Aleve or Tylenol as needed for pain.  All questions were answered and she agrees to plan.  No orders of the defined types were placed in this encounter.    Discharge Instructions      You have an ankle sprain X rays did not show any broken bones Rest, Ice, Compression, and Elevate your ankle Brace can be worn for extra support It might continue to swell for a few more weeks but should get better! It was nice meeting you today!        Reviewed expectations re: course of current medical issues. Questions answered. Outlined signs and symptoms indicating need for more acute intervention. Patient verbalized understanding. After Visit Summary given.   SUBJECTIVE: Pleasant 26 year old female here for evaluation of right ankle pain.  Last night she was walking her dog and tripped on a stick.  She felt and heard a pop in the lateral aspect of her right ankle.  Inversion mechanism.  She immediately noticed pain and swelling at the lateral aspect of the right ankle.  She was able to continue walking home but walked with a limp.  She took an Advil, ice to the ankle, and elevated it last night.  Today the ankle is still quite swollen so she came here to be evaluated further.  She denies any prior injury to the ankle.  Denies any numbness or tingling in the foot.  No LMP recorded. (Menstrual status: IUD). Past Surgical History:  Procedure Laterality Date   WISDOM TOOTH EXTRACTION       OBJECTIVE:  Vitals:   01/09/22 1216  BP: (!) 125/93  Pulse: 78  Resp: 18  Temp: 99.3 F (37.4 C)  TempSrc: Oral   SpO2: 98%     Physical Exam Vitals reviewed.  Constitutional:      General: She is not in acute distress.    Appearance: She is obese. She is not ill-appearing.  Cardiovascular:     Rate and Rhythm: Normal rate.  Pulmonary:     Effort: Pulmonary effort is normal.  Musculoskeletal:     Comments: R Ankle -there is obvious soft tissue swelling and edema around the lateral aspect of the ankle.  She is tender to palpation posterior to the lateral malleolus and anterior to the lateral malleolus over ATFL.  She is nontender at the medial malleolus nor the base of the fifth metatarsal.  Plantarflexion and dorsiflexion is limited by pain.  She has full sensation in the toes.  She has a 2+ anterior drawer test.  Neurological:     Mental Status: She is alert.      Labs: Results for orders placed or performed during the hospital encounter of 11/03/21  Lipase, blood  Result Value Ref Range   Lipase 29 11 - 51 U/L  Comprehensive metabolic panel  Result Value Ref Range   Sodium 136 135 - 145 mmol/L   Potassium 4.1 3.5 - 5.1 mmol/L   Chloride 103 98 - 111 mmol/L   CO2 23 22 - 32 mmol/L   Glucose, Bld 94 70 - 99 mg/dL  BUN 14 6 - 20 mg/dL   Creatinine, Ser 0.76 0.44 - 1.00 mg/dL   Calcium 9.3 8.9 - 10.3 mg/dL   Total Protein 8.3 (H) 6.5 - 8.1 g/dL   Albumin 4.7 3.5 - 5.0 g/dL   AST 25 15 - 41 U/L   ALT 13 0 - 44 U/L   Alkaline Phosphatase 40 38 - 126 U/L   Total Bilirubin 1.1 0.3 - 1.2 mg/dL   GFR, Estimated >60 >60 mL/min   Anion gap 10 5 - 15  CBC  Result Value Ref Range   WBC 13.1 (H) 4.0 - 10.5 K/uL   RBC 5.13 (H) 3.87 - 5.11 MIL/uL   Hemoglobin 15.8 (H) 12.0 - 15.0 g/dL   HCT 45.8 36.0 - 46.0 %   MCV 89.3 80.0 - 100.0 fL   MCH 30.8 26.0 - 34.0 pg   MCHC 34.5 30.0 - 36.0 g/dL   RDW 12.8 11.5 - 15.5 %   Platelets 337 150 - 400 K/uL   nRBC 0.0 0.0 - 0.2 %  Urinalysis, Routine w reflex microscopic  Result Value Ref Range   Color, Urine YELLOW YELLOW   APPearance CLEAR  CLEAR   Specific Gravity, Urine 1.013 1.005 - 1.030   pH 6.0 5.0 - 8.0   Glucose, UA NEGATIVE NEGATIVE mg/dL   Hgb urine dipstick NEGATIVE NEGATIVE   Bilirubin Urine NEGATIVE NEGATIVE   Ketones, ur 5 (A) NEGATIVE mg/dL   Protein, ur NEGATIVE NEGATIVE mg/dL   Nitrite NEGATIVE NEGATIVE   Leukocytes,Ua NEGATIVE NEGATIVE  I-Stat beta hCG blood, ED  Result Value Ref Range   I-stat hCG, quantitative <5.0 <5 mIU/mL   Comment 3           Labs Reviewed - No data to display  Imaging: DG Ankle Complete Right  Result Date: 01/09/2022 CLINICAL DATA:  A 26 year old female presents following fall after hearing pop about the ankle. Worsening pain. EXAM: RIGHT ANKLE - COMPLETE 3+ VIEW COMPARISON:  None Available. FINDINGS: Marked soft tissue swelling over the lateral ankle and lateral malleolus. No visible fracture or sign of dislocation. Ankle mortise is intact. Signs of ankle joint effusion. IMPRESSION: Lateral ankle soft tissue swelling and joint effusion without visible fracture. Electronically Signed   By: Zetta Bills M.D.   On: 01/09/2022 12:35     Allergies  Allergen Reactions   Banana Itching and Swelling   Rizatriptan Anxiety    Other reaction(s): Other (See Comments) Body wide migraine.  Right side body pain.                                                Past Medical History:  Diagnosis Date   Anxiety    Depression    Migraine    Panic attack     Social History   Socioeconomic History   Marital status: Single    Spouse name: Not on file   Number of children: 0   Years of education: Not on file   Highest education level: Some college, no degree  Occupational History   Not on file  Tobacco Use   Smoking status: Never   Smokeless tobacco: Never  Vaping Use   Vaping Use: Never used  Substance and Sexual Activity   Alcohol use: Yes    Comment: socially   Drug use: Yes    Types: Marijuana  Comment: socially   Sexual activity: Not on file  Other Topics Concern    Not on file  Social History Narrative   Not on file   Social Determinants of Health   Financial Resource Strain: Not on file  Food Insecurity: Not on file  Transportation Needs: Not on file  Physical Activity: Not on file  Stress: Not on file  Social Connections: Not on file  Intimate Partner Violence: Not on file    Family History  Problem Relation Age of Onset   Healthy Mother    Anxiety disorder Mother    Healthy Father    Depression Sister    Anxiety disorder Sister       Arvella Nigh, MD 01/09/22 1345

## 2022-01-25 ENCOUNTER — Encounter (HOSPITAL_BASED_OUTPATIENT_CLINIC_OR_DEPARTMENT_OTHER): Payer: No Typology Code available for payment source | Admitting: Nurse Practitioner

## 2022-01-27 ENCOUNTER — Emergency Department (HOSPITAL_COMMUNITY)
Admission: EM | Admit: 2022-01-27 | Discharge: 2022-01-28 | Disposition: A | Discharge status: Home / Self Care | Payer: No Typology Code available for payment source | Attending: Emergency Medicine | Admitting: Emergency Medicine

## 2022-01-27 ENCOUNTER — Encounter (HOSPITAL_COMMUNITY): Payer: Self-pay | Admitting: Emergency Medicine

## 2022-01-27 ENCOUNTER — Emergency Department (HOSPITAL_COMMUNITY): Payer: No Typology Code available for payment source

## 2022-01-27 ENCOUNTER — Other Ambulatory Visit: Payer: Self-pay

## 2022-01-27 DIAGNOSIS — Y9301 Activity, walking, marching and hiking: Secondary | ICD-10-CM | POA: Insufficient documentation

## 2022-01-27 DIAGNOSIS — S99912A Unspecified injury of left ankle, initial encounter: Secondary | ICD-10-CM | POA: Diagnosis present

## 2022-01-27 DIAGNOSIS — X500XXA Overexertion from strenuous movement or load, initial encounter: Secondary | ICD-10-CM | POA: Diagnosis not present

## 2022-01-27 DIAGNOSIS — S82892A Other fracture of left lower leg, initial encounter for closed fracture: Secondary | ICD-10-CM | POA: Diagnosis not present

## 2022-01-27 DIAGNOSIS — S93402A Sprain of unspecified ligament of left ankle, initial encounter: Secondary | ICD-10-CM

## 2022-01-27 NOTE — ED Triage Notes (Signed)
Patient here after walking her dogs on Friday and is now complaining of L ankle pain. Patient presents w/ brace to L ankle but patient reports swelling and bruising to the L ankle. This was visualized as well. Patient denies a fall. Was simply just walking.

## 2022-01-28 ENCOUNTER — Emergency Department (HOSPITAL_COMMUNITY): Payer: No Typology Code available for payment source

## 2022-01-28 LAB — POC URINE PREG, ED: Preg Test, Ur: NEGATIVE

## 2022-01-28 MED ORDER — OXYCODONE-ACETAMINOPHEN 5-325 MG PO TABS: 1.0000 | ORAL_TABLET | Freq: Three times a day (TID) | ORAL | 0 refills | Status: DC | PRN

## 2022-01-28 MED ORDER — OXYCODONE-ACETAMINOPHEN 5-325 MG PO TABS
1.0000 | ORAL_TABLET | Freq: Once | ORAL | Status: AC
  Administered 2022-01-28: 1 via ORAL
  Filled 2022-01-28: qty 1

## 2022-01-28 NOTE — ED Notes (Signed)
Returned from CT.

## 2022-01-28 NOTE — Progress Notes (Signed)
Orthopedic Tech Progress Note Patient Details:  Erika Durham December 28, 1995 [id number]  Ortho Devices Type of Ortho Device: Crutches, Ace wrap Ortho Device/Splint Location: lle ankle Ortho Device/Splint Interventions: Ordered, Application, Adjustment   Post Interventions Patient Tolerated: Well Instructions Provided: Care of device, Adjustment of device  [name] 01/28/2022, 4:38 AM

## 2022-01-28 NOTE — ED Notes (Signed)
Patient transported to CT 

## 2022-01-28 NOTE — ED Provider Notes (Signed)
[facility] EMERGENCY DEPARTMENT Provider Note   CSN: [id number] Arrival date & time: 01/27/22  1651     History  Chief Complaint  Patient presents with   Ankle Injury    Erika Durham is a 26 y.o. female.  26 year old female who presents the ER today secondary to left ankle pain.  She states that she was walking her dogs on Friday and they pulled her leash and she sprained her ankle.  Is initially just swollen with little bit of pain but it progressively worsened over the weekend and started having a bunch of ecchymosis with a 2 so she presents here for further evaluation.  She can bear weight but it is very painful.   Ankle Injury       Home Medications Prior to Admission medications   Medication Sig Start Date End Date Taking? Authorizing Provider  albuterol (VENTOLIN HFA) 108 (90 Base) MCG/ACT inhaler Inhale 2 puffs into the lungs every 6 (six) hours as needed for wheezing or shortness of breath. 04/25/21   Brunetta Jeans, PA-C  clonazePAM (KLONOPIN) 0.5 MG tablet Take 0.5 tablets (0.25 mg total) by mouth daily as needed for anxiety. 09/03/21   Orma Render, NP  cyclobenzaprine (FLEXERIL) 5 MG tablet Take 1 tablet (5 mg total) by mouth 3 (three) times daily as needed for muscle spasms. 09/03/21   Orma Render, NP  escitalopram (LEXAPRO) 20 MG tablet Take 1 tablet (20 mg total) by mouth daily. 09/03/21   Orma Render, NP  gabapentin (NEURONTIN) 100 MG capsule Take 1 capsule (100 mg total) by mouth 3 (three) times daily. 09/03/21   Orma Render, NP  Melatonin 3 MG CAPS Take by mouth. 09/27/20   [provider]  meloxicam (MOBIC) 15 MG tablet Take 1 tablet (15 mg total) by mouth daily. 09/03/21   Orma Render, NP  oxyCODONE-acetaminophen (PERCOCET) 5-325 MG tablet Take 1 tablet by mouth every 8 (eight) hours as needed. 01/28/22   Donaven Criswell, Corene Cornea, MD  propranolol (INDERAL) 20 MG tablet Take 1 tablet (20 mg total) by mouth 2 (two) times daily. 09/03/21    Orma Render, NP      Allergies    Banana and Rizatriptan    Review of Systems   Review of Systems  Physical Exam Updated Vital Signs BP 128/76   Pulse 88   Temp 98.9 F (37.2 C) (Oral)   Resp 16   SpO2 99%  Physical Exam Vitals and nursing note reviewed.  Constitutional:      Appearance: She is well-developed.  HENT:     Head: Normocephalic and atraumatic.  Eyes:     Pupils: Pupils are equal, round, and reactive to light.  Cardiovascular:     Rate and Rhythm: Normal rate and regular rhythm.  Pulmonary:     Effort: No respiratory distress.     Breath sounds: No stridor.  Abdominal:     General: Abdomen is flat. There is no distension.  Musculoskeletal:        General: Swelling (Associated with ecchymosis in her left ankle) and tenderness (Left ankle and also proximal fibula.) present.     Cervical back: Normal range of motion.  Skin:    General: Skin is warm and dry.  Neurological:     General: No focal deficit present.     Mental Status: She is alert.     ED Results / Procedures / Treatments   Labs (all labs ordered are listed, but  only abnormal results are displayed) Labs Reviewed  POC URINE PREG, ED    EKG None  Radiology CT Tibia Fibula Left Wo Contrast  Result Date: 01/28/2022 CLINICAL DATA:  Left ankle pain swelling and bruising EXAM: CT OF THE LOWER LEFT EXTREMITY WITHOUT CONTRAST TECHNIQUE: Multidetector CT imaging of the lower left extremity was performed according to the standard protocol. RADIATION DOSE REDUCTION: This exam was performed according to the departmental dose-optimization program which includes automated exposure control, adjustment of the mA and/or kV according to patient size and/or use of iterative reconstruction technique. COMPARISON:  Radiographs 01/27/2022 FINDINGS: Bones/Joint/Cartilage There is a tiny 2-3 mm linear osseous fragment anterior and lateral to the lateral malleolus. No definite donor site is identified however given  the adjacent edema subtle avulsion fracture is suspected. No additional fractures identified. Ligaments Suboptimally assessed by CT. Muscles and Tendons Grossly unremarkable. Soft tissues Soft tissue edema about the lateral malleolus and dorsum of the foot. IMPRESSION: Suspected tiny avulsion fracture about the lateral malleolus with associated soft tissue swelling. Electronically Signed   By: Placido Sou M.D.   On: 01/28/2022 02:26   DG Ankle Complete Left  Result Date: 01/27/2022 CLINICAL DATA:  Pain and swelling EXAM: LEFT ANKLE COMPLETE - 3+ VIEW COMPARISON:  None Available. FINDINGS: No fracture or dislocation is seen. There is soft tissue swelling around the ankle, more so over the lateral malleolus. IMPRESSION: No fracture or dislocation is seen in left ankle. Electronically Signed   By: Elmer Picker M.D.   On: 01/27/2022 18:44   DG Foot Complete Left  Result Date: 01/27/2022 CLINICAL DATA:  Swelling injury EXAM: LEFT FOOT - COMPLETE 3+ VIEW COMPARISON:  None Available. FINDINGS: There is no evidence of fracture or dislocation. There is no evidence of arthropathy or other focal bone abnormality. Soft tissues are unremarkable. IMPRESSION: Negative. Electronically Signed   By: Donavan Foil M.D.   On: 01/27/2022 18:05   DG Knee Complete 4 Views Left  Result Date: 01/27/2022 CLINICAL DATA:  Swelling injury EXAM: LEFT KNEE - COMPLETE 4+ VIEW COMPARISON:  None Available. FINDINGS: No evidence of fracture, dislocation, or joint effusion. No evidence of arthropathy or other focal bone abnormality. Soft tissues are unremarkable. IMPRESSION: Negative. Electronically Signed   By: Donavan Foil M.D.   On: 01/27/2022 18:05    Procedures Procedures    Medications Ordered in ED Medications  oxyCODONE-acetaminophen (PERCOCET/ROXICET) 5-325 MG per tablet 1 tablet (1 tablet Oral Given 01/28/22 0100)    ED Course/ Medical Decision Making/ A&P                           Medical Decision  Making Amount and/or Complexity of Data Reviewed Radiology: ordered.  Risk Prescription drug management.   Patient with normal x-rays viewed by myself and radiology.  However she had significant swelling, ecchymosis and pain with any kind of range of motion of her ankle difficulty bearing weight so CT scan done to evaluate for any internal derangement or occult fractures.  This showed a small avulsion fracture.  I suspect she is has a severe ankle sprain.  Unable to assess her ligaments at this time so may need an MRI if not improved in a week.  Discussed with her nonweightbearing for couple days and slowly increasing her weightbearing status and if not with significant improvement in the next 5 to 7 days she is to follow-up with Ortho or primary doctor to consider an MRI. Shared  decision making with patient and she prefers ace wrap and crutches rather than a walking boot at this time.   Final Clinical Impression(s) / ED Diagnoses Final diagnoses:  Sprain of left ankle, unspecified ligament, initial encounter  Closed avulsion fracture of left ankle, initial encounter    Rx / DC Orders ED Discharge Orders          Ordered    oxyCODONE-acetaminophen (PERCOCET) 5-325 MG tablet  Every 8 hours PRN,   Status:  Discontinued        01/28/22 0328    oxyCODONE-acetaminophen (PERCOCET) 5-325 MG tablet  Every 8 hours PRN        01/28/22 0328              Taura Lamarre, Corene Cornea, MD 01/28/22 [phone]

## 2022-03-04 ENCOUNTER — Ambulatory Visit (HOSPITAL_BASED_OUTPATIENT_CLINIC_OR_DEPARTMENT_OTHER): Payer: No Typology Code available for payment source | Admitting: Nurse Practitioner

## 2022-05-06 ENCOUNTER — Ambulatory Visit
Admission: EM | Admit: 2022-05-06 | Discharge: 2022-05-06 | Disposition: A | Discharge status: Home / Self Care | Payer: No Typology Code available for payment source | Attending: Urgent Care | Admitting: Urgent Care

## 2022-05-06 DIAGNOSIS — J019 Acute sinusitis, unspecified: Secondary | ICD-10-CM

## 2022-05-06 DIAGNOSIS — J309 Allergic rhinitis, unspecified: Secondary | ICD-10-CM | POA: Diagnosis not present

## 2022-05-06 DIAGNOSIS — F1290 Cannabis use, unspecified, uncomplicated: Secondary | ICD-10-CM | POA: Diagnosis not present

## 2022-05-06 DIAGNOSIS — R062 Wheezing: Secondary | ICD-10-CM | POA: Diagnosis not present

## 2022-05-06 MED ORDER — CETIRIZINE HCL 10 MG PO TABS: 10.0000 mg | ORAL_TABLET | Freq: Every day | ORAL | 0 refills | Status: DC

## 2022-05-06 MED ORDER — PROMETHAZINE-DM 6.25-15 MG/5ML PO SYRP: 2.5000 mL | ORAL_SOLUTION | Freq: Three times a day (TID) | ORAL | 0 refills | Status: DC | PRN

## 2022-05-06 MED ORDER — PSEUDOEPHEDRINE HCL 60 MG PO TABS: 60.0000 mg | ORAL_TABLET | Freq: Three times a day (TID) | ORAL | 0 refills | Status: DC | PRN

## 2022-05-06 MED ORDER — AMOXICILLIN 875 MG PO TABS: 875.0000 mg | ORAL_TABLET | Freq: Two times a day (BID) | ORAL | 0 refills | Status: DC

## 2022-05-06 MED ORDER — ALBUTEROL SULFATE HFA 108 (90 BASE) MCG/ACT IN AERS: 1.0000 | INHALATION_SPRAY | Freq: Four times a day (QID) | RESPIRATORY_TRACT | 0 refills | Status: AC | PRN

## 2022-05-06 NOTE — ED Triage Notes (Signed)
Pt states that she has some nasal congestion and cough. X1 week

## 2022-05-06 NOTE — ED Provider Notes (Signed)
[facility]  Note:  This document was prepared using Systems analyst and may include unintentional dictation errors.  MRN: [id number] DOB: 28-Apr-1996  Subjective:   Erika Durham is a 27 y.o. female presenting for 1 week history of acute onset persistent sinus congestion, sinus pressure, drainage, coughing. Smokes marijuana every other day. She does get shob, wheezing when she walks her dogs.  No overt fever, chest pain, body aches.  No current facility-administered medications for this encounter.  Current Outpatient Medications:    albuterol (VENTOLIN HFA) 108 (90 Base) MCG/ACT inhaler, Inhale 2 puffs into the lungs every 6 (six) hours as needed for wheezing or shortness of breath., Disp: 8 g, Rfl: 0   clonazePAM (KLONOPIN) 0.5 MG tablet, Take 0.5 tablets (0.25 mg total) by mouth daily as needed for anxiety., Disp: 45 tablet, Rfl: 1   cyclobenzaprine (FLEXERIL) 5 MG tablet, Take 1 tablet (5 mg total) by mouth 3 (three) times daily as needed for muscle spasms., Disp: 180 tablet, Rfl: 3   escitalopram (LEXAPRO) 20 MG tablet, Take 1 tablet (20 mg total) by mouth daily., Disp: 900 tablet, Rfl: 3   gabapentin (NEURONTIN) 100 MG capsule, Take 1 capsule (100 mg total) by mouth 3 (three) times daily., Disp: 270 capsule, Rfl: 3   Melatonin 3 MG CAPS, Take by mouth., Disp: , Rfl:    meloxicam (MOBIC) 15 MG tablet, Take 1 tablet (15 mg total) by mouth daily., Disp: 90 tablet, Rfl: 3   oxyCODONE-acetaminophen (PERCOCET) 5-325 MG tablet, Take 1 tablet by mouth every 8 (eight) hours as needed., Disp: 10 tablet, Rfl: 0   propranolol (INDERAL) 20 MG tablet, Take 1 tablet (20 mg total) by mouth 2 (two) times daily., Disp: 180 tablet, Rfl: 3   Allergies  Allergen Reactions   Banana Itching and Swelling   Rizatriptan Anxiety    Other reaction(s): Other (See Comments) Body wide migraine.  Right side body pain.     Past Medical History:  Diagnosis Date    Anxiety    Depression    Migraine    Panic attack      Past Surgical History:  Procedure Laterality Date   WISDOM TOOTH EXTRACTION      Family History  Problem Relation Age of Onset   Healthy Mother    Anxiety disorder Mother    Healthy Father    Depression Sister    Anxiety disorder Sister     Social History   Tobacco Use   Smoking status: Never   Smokeless tobacco: Never  Vaping Use   Vaping Use: Never used  Substance Use Topics   Alcohol use: Yes    Comment: socially   Drug use: Yes    Types: Marijuana    Comment: socially    ROS   Objective:   Vitals: BP 119/83 (BP Location: Right Arm)   Pulse 75   Temp 98.9 F (37.2 C) (Oral)   Resp 18   Ht 5\' 1"  (1.549 m)   Wt 194 lb (88 kg)   LMP 04/02/2022   SpO2 97%   BMI 36.66 kg/m   Physical Exam Constitutional:      General: She is not in acute distress.    Appearance: Normal appearance. She is well-developed and normal weight. She is not ill-appearing, toxic-appearing or diaphoretic.  HENT:     Head: Normocephalic and atraumatic.     Right Ear: Tympanic membrane, ear canal and external ear normal. No drainage or tenderness.  No middle ear effusion. There is no impacted cerumen. Tympanic membrane is not erythematous or bulging.     Left Ear: Tympanic membrane, ear canal and external ear normal. No drainage or tenderness.  No middle ear effusion. There is no impacted cerumen. Tympanic membrane is not erythematous or bulging.     Nose: Nose normal. No congestion or rhinorrhea.     Mouth/Throat:     Mouth: Mucous membranes are moist. No oral lesions.     Pharynx: No pharyngeal swelling, oropharyngeal exudate, posterior oropharyngeal erythema or uvula swelling.     Tonsils: No tonsillar exudate or tonsillar abscesses.  Eyes:     General: No scleral icterus.       Right eye: No discharge.        Left eye: No discharge.     Extraocular Movements: Extraocular movements intact.     Right eye: Normal  extraocular motion.     Left eye: Normal extraocular motion.     Conjunctiva/sclera: Conjunctivae normal.  Cardiovascular:     Rate and Rhythm: Normal rate and regular rhythm.     Heart sounds: Normal heart sounds. No murmur heard.    No friction rub. No gallop.  Pulmonary:     Effort: Pulmonary effort is normal. No respiratory distress.     Breath sounds: No stridor. No wheezing, rhonchi or rales.  Chest:     Chest wall: No tenderness.  Musculoskeletal:     Cervical back: Normal range of motion and neck supple.  Lymphadenopathy:     Cervical: No cervical adenopathy.  Skin:    General: Skin is warm and dry.  Neurological:     General: No focal deficit present.     Mental Status: She is alert and oriented to person, place, and time.  Psychiatric:        Mood and Affect: Mood normal.        Behavior: Behavior normal.     Assessment and Plan :   PDMP not reviewed this encounter.  1. Acute sinusitis, recurrence not specified, unspecified location   2. Wheezing   3. Marijuana use   4. Allergic rhinitis, unspecified seasonality, unspecified trigger     Will start empiric treatment for sinusitis with amoxicillin.  Provided her with an albuterol inhaler to trial given the wheezing, marijuana use.  Will defer prednisone for now.   Deferred imaging given clear cardiopulmonary exam, hemodynamically stable vital signs. Recommended supportive care otherwise. Counseled patient on potential for adverse effects with medications prescribed/recommended today, ER and return-to-clinic precautions discussed, patient verbalized understanding.    Jaynee Eagles, [place] 05/06/22 [id number]

## 2022-05-08 IMAGING — CT CT HEAD W/O CM
4 series · 16 of 47 positions shown, 18 images · non-contrast
Comparison: None.

CLINICAL DATA: Headache, posttraumatic

EXAM:
CT HEAD WITHOUT CONTRAST
TECHNIQUE: Contiguous axial images were obtained from the base of the skull
through the vertex without intravenous contrast.

[Series 3: head without · axial · non-contrast · 0.42mm/px · z∈[-146,-26]mm · 7 of 32 slices shown, 9 images]
[im 4/32  brain]
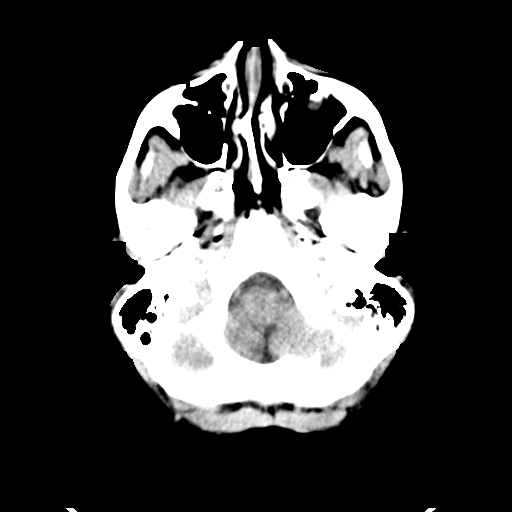
[im 4/32  bone]
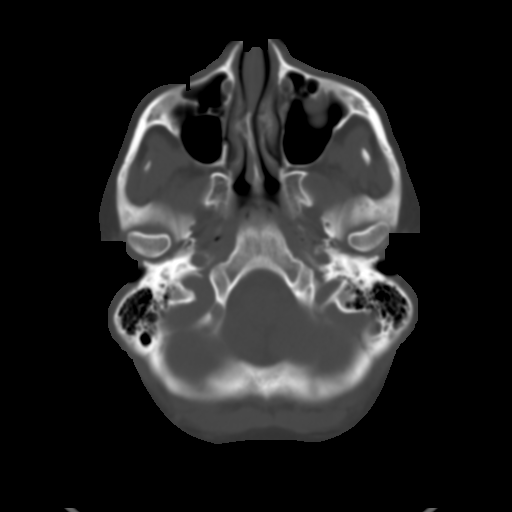
[im 8/32  brain]
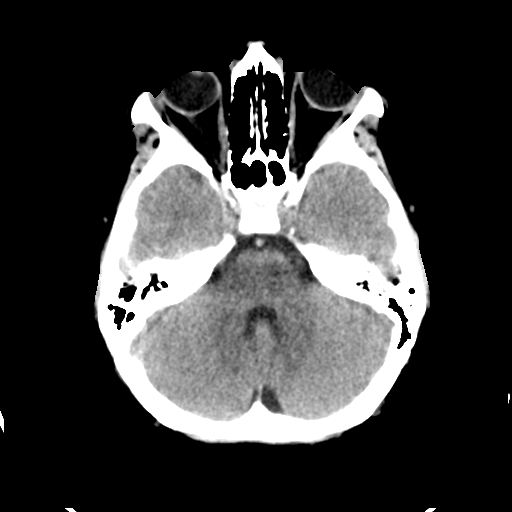
[im 12/32  brain]
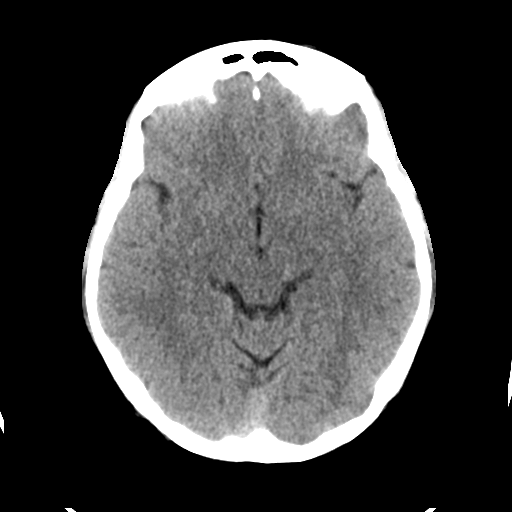
[im 16/32  brain]
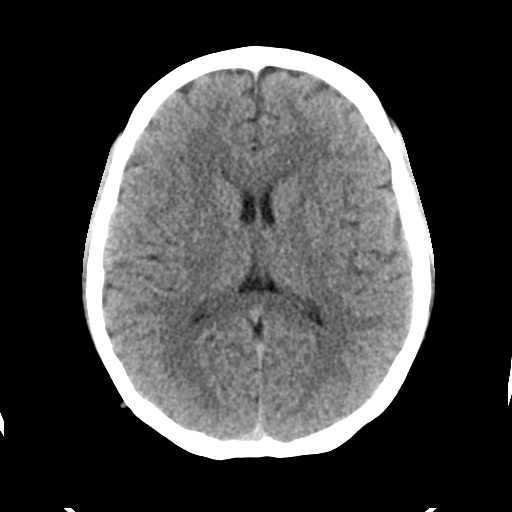
[im 20/32  brain]
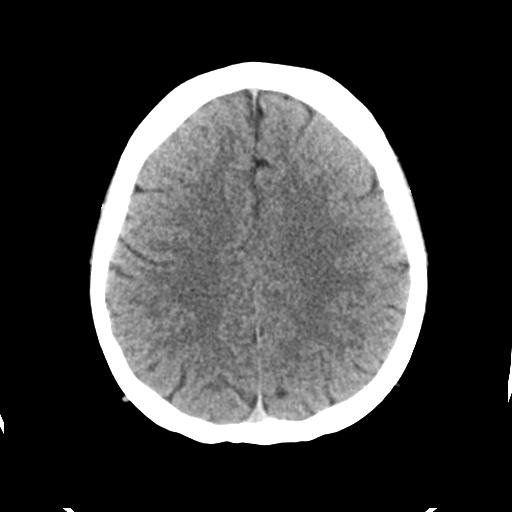
[im 20/32  bone]
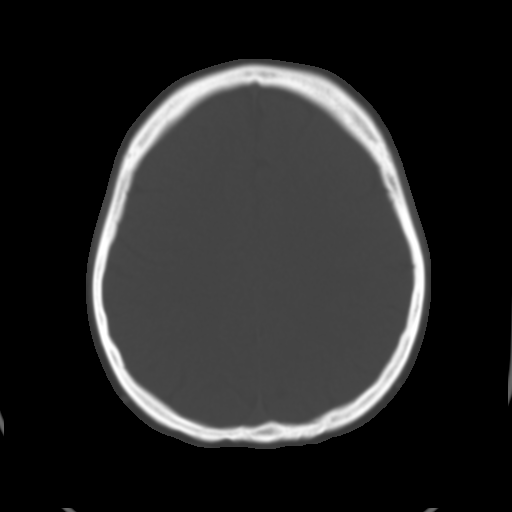
[im 24/32  brain]
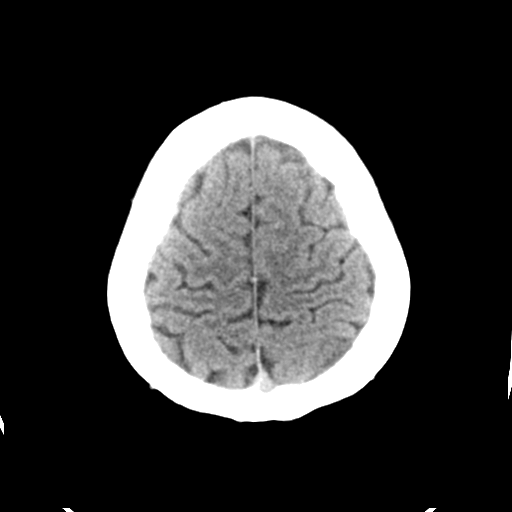
[im 28/32  brain]
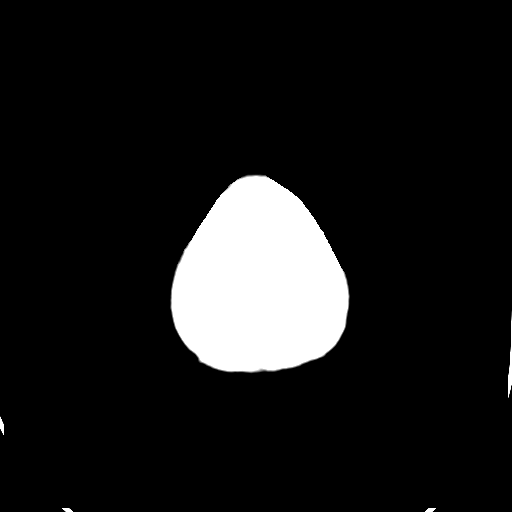

[Series 4: head bone · axial · 0.42mm/px · z∈[-148,-116]mm · 3 of 79 slices shown]
[im 8/79  bone]
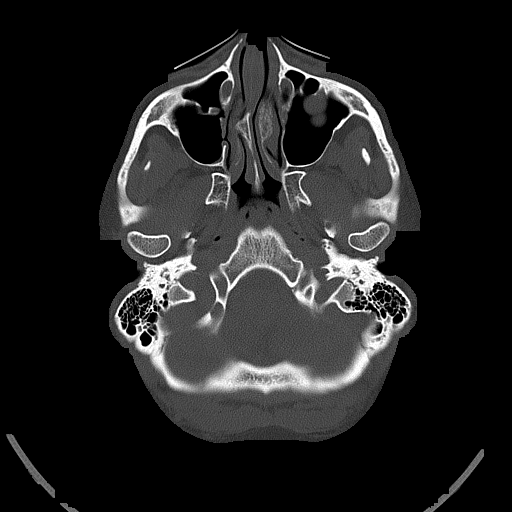
[im 16/79  bone]
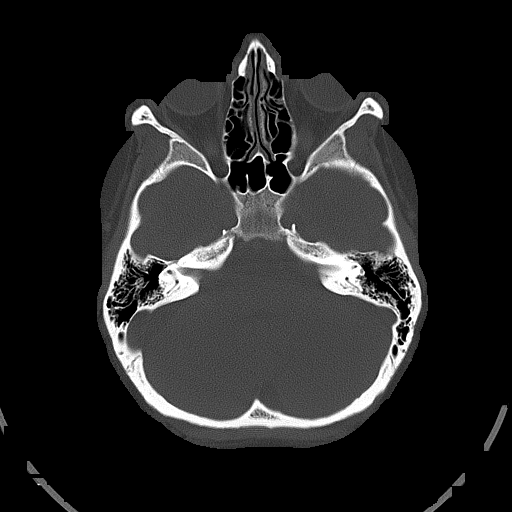
[im 24/79  bone]
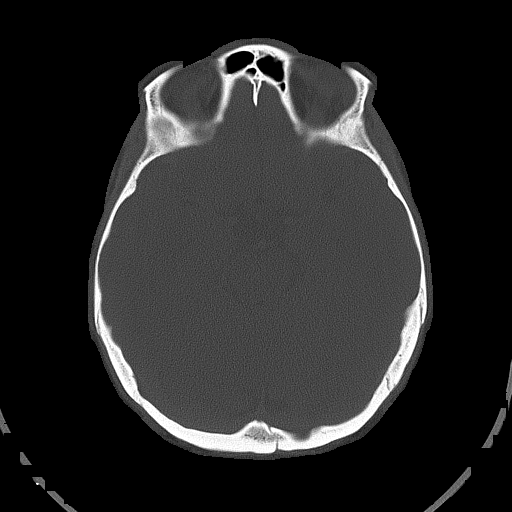

[Series 5: head without cor · coronal · non-contrast · 0.31mm/px · 3 of 67 slices shown]
[im 23/67  brain]
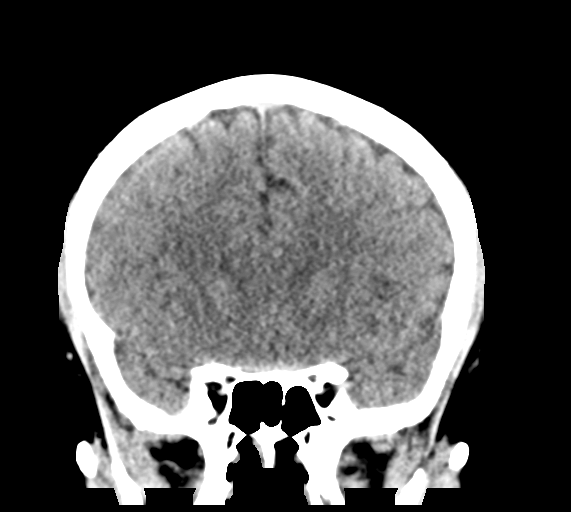
[im 30/67  brain]
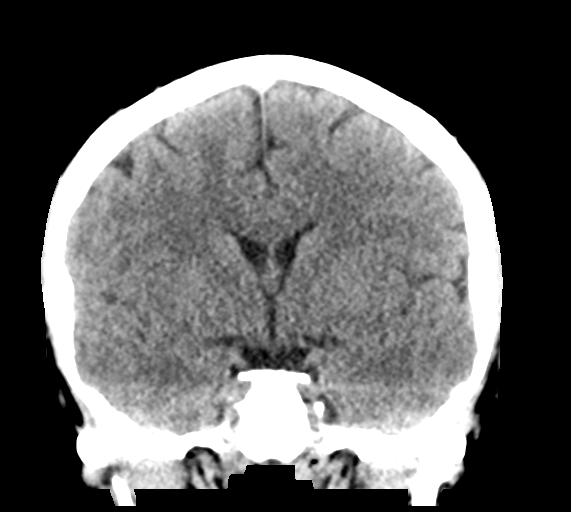
[im 37/67  brain]
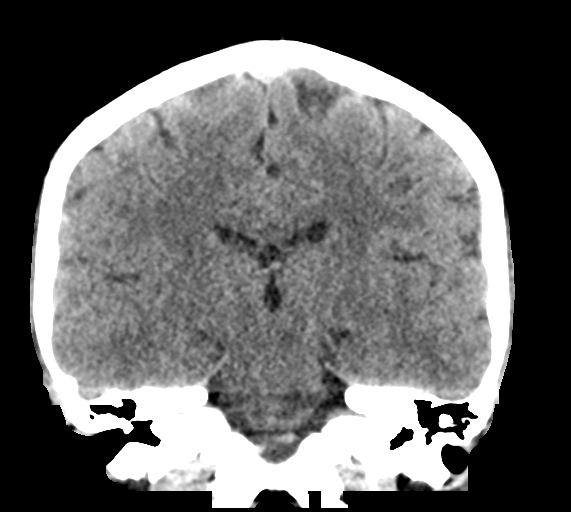

[Series 6: head without sag · sagittal · non-contrast · 0.31mm/px · 3 of 59 slices shown]
[im 20/59  brain]
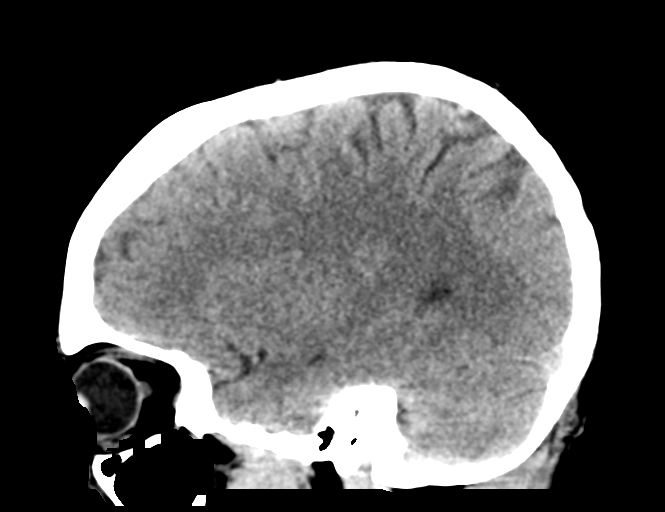
[im 30/59  brain]
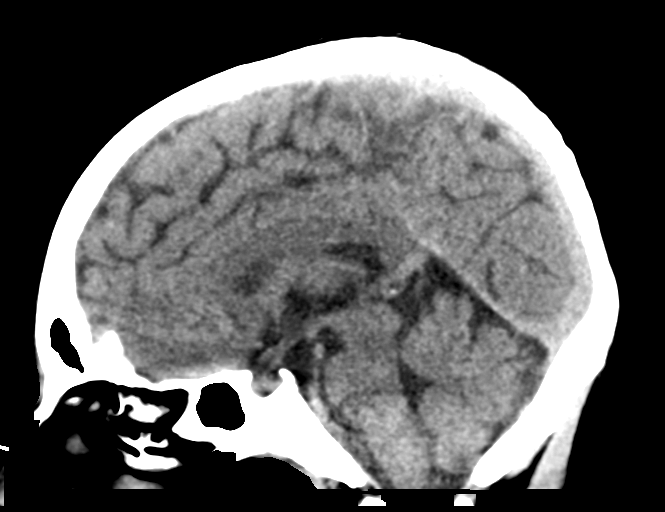
[im 39/59  brain]
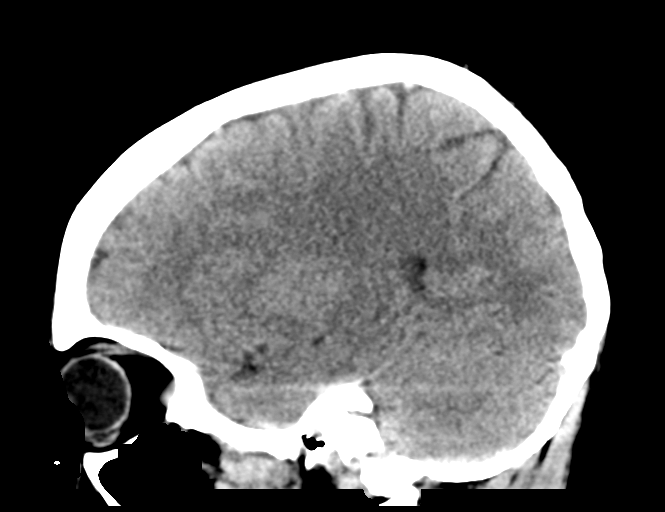

[16 of 47 positions shown; findings below may reference images not displayed]

FINDINGS: Brain: No evidence of acute infarction, hemorrhage, cerebral edema,
mass, mass effect, or midline shift. Ventricles and sulci are within
normal limits for age. No extra-axial fluid collection.

Vascular: No hyperdense vessel or unexpected calcification.

Skull: Normal. Negative for fracture or focal lesion.

Sinuses/Orbits: Mucous retention cysts in the left maxillary sinus.
The orbits are unremarkable.

Other: The mastoid air cells are well aerated.
IMPRESSION: No acute intracranial process.

## 2022-06-13 ENCOUNTER — Encounter (HOSPITAL_COMMUNITY): Payer: Self-pay

## 2022-06-13 ENCOUNTER — Other Ambulatory Visit: Payer: Self-pay

## 2022-06-13 ENCOUNTER — Emergency Department (HOSPITAL_COMMUNITY)
Admission: EM | Admit: 2022-06-13 | Discharge: 2022-06-13 | Disposition: A | Discharge status: Home / Self Care | Payer: No Typology Code available for payment source | Attending: Emergency Medicine | Admitting: Emergency Medicine

## 2022-06-13 ENCOUNTER — Emergency Department (HOSPITAL_COMMUNITY): Payer: No Typology Code available for payment source

## 2022-06-13 DIAGNOSIS — R110 Nausea: Secondary | ICD-10-CM | POA: Diagnosis not present

## 2022-06-13 DIAGNOSIS — R109 Unspecified abdominal pain: Secondary | ICD-10-CM | POA: Insufficient documentation

## 2022-06-13 DIAGNOSIS — N939 Abnormal uterine and vaginal bleeding, unspecified: Secondary | ICD-10-CM | POA: Diagnosis present

## 2022-06-13 DIAGNOSIS — R42 Dizziness and giddiness: Secondary | ICD-10-CM | POA: Diagnosis not present

## 2022-06-13 LAB — CBC
HCT: 41.1 % (ref 36.0–46.0)
Hemoglobin: 14.1 g/dL (ref 12.0–15.0)
MCH: 30.3 pg (ref 26.0–34.0)
MCHC: 34.3 g/dL (ref 30.0–36.0)
MCV: 88.2 fL (ref 80.0–100.0)
Platelets: 348 10*3/uL (ref 150–400)
RBC: 4.66 MIL/uL (ref 3.87–5.11)
RDW: 13.2 % (ref 11.5–15.5)
WBC: 9.4 10*3/uL (ref 4.0–10.5)
nRBC: 0 % (ref 0.0–0.2)

## 2022-06-13 LAB — I-STAT BETA HCG BLOOD, ED (MC, WL, AP ONLY): I-stat hCG, quantitative: <5 m[IU]/mL (ref <5)

## 2022-06-13 MED ORDER — ONDANSETRON 4 MG PO TBDP
4.0000 mg | ORAL_TABLET | Freq: Once | ORAL | Status: AC
  Administered 2022-06-13: 4 mg via ORAL
  Filled 2022-06-13: qty 1

## 2022-06-13 MED ORDER — NAPROXEN 500 MG PO TABS: 500.0000 mg | ORAL_TABLET | Freq: Two times a day (BID) | ORAL | 0 refills | Status: AC

## 2022-06-13 MED ORDER — IBUPROFEN 400 MG PO TABS
600.0000 mg | ORAL_TABLET | Freq: Once | ORAL | Status: AC
  Administered 2022-06-13: 600 mg via ORAL
  Filled 2022-06-13: qty 1

## 2022-06-13 NOTE — ED Triage Notes (Signed)
Pt came in via POV d/t sudden onset stabbing abd pain & vaginal bleeding after having her IUD removed yesterday. Felt like passing out & vomiting in that moment, pain comes & goes & nausea is still persistent. A/Ox4, rates abd pain 6/10 in triage.

## 2022-06-13 NOTE — ED Notes (Signed)
All discharge instructions reviewed with patient including follow up care and prescriptions. Patient verbalized understanding of same and had no other questions. Patient stable and ambulatory at time of discharge.  

## 2022-06-13 NOTE — ED Provider Notes (Signed)
[facility] Provider Note   CSN: [id number] Arrival date & time: 06/13/22  1734     History  Chief Complaint  Patient presents with   Abdominal Pain   Vaginal Bleeding   Nausea   Dizziness    Erika Durham is a 27 y.o. female with a past medical history of anxiety, depression presenting today for evaluation of vaginal bleeding.  Patient reports [facility].  About 5 PM today she started to have significant vaginal bleeding with associated abdominal cramping.  She reports soaking through 1 pad.  She reports LMP 06/10/2022.  She reports feeling nauseous without vomiting.  No fever.  Reports using Tylenol at home with no relief.  She denies any urinary symptoms.   Abdominal Pain Associated symptoms: vaginal bleeding   Vaginal Bleeding Associated symptoms: abdominal pain and dizziness   Dizziness   Past Medical History:  Diagnosis Date   Anxiety    Depression    Migraine    Panic attack    Past Surgical History:  Procedure Laterality Date   WISDOM TOOTH EXTRACTION       Home Medications Prior to Admission medications   Medication Sig Start Date End Date Taking? Authorizing Provider  naproxen (NAPROSYN) 500 MG tablet Take 1 tablet (500 mg total) by mouth 2 (two) times daily. 06/13/22  Yes Rex Kras, PA  albuterol (VENTOLIN HFA) 108 (90 Base) MCG/ACT inhaler Inhale 1-2 puffs into the lungs every 6 (six) hours as needed for wheezing or shortness of breath. 05/06/22   Jaynee Eagles, PA-C  amoxicillin (AMOXIL) 875 MG tablet Take 1 tablet (875 mg total) by mouth 2 (two) times daily. 05/06/22   Jaynee Eagles, PA-C  cetirizine (ZYRTEC ALLERGY) 10 MG tablet Take 1 tablet (10 mg total) by mouth daily. 05/06/22   Jaynee Eagles, PA-C  clonazePAM (KLONOPIN) 0.5 MG tablet Take 0.5 tablets (0.25 mg total) by mouth daily as needed for anxiety. 09/03/21   Orma Render, NP  cyclobenzaprine  (FLEXERIL) 5 MG tablet Take 1 tablet (5 mg total) by mouth 3 (three) times daily as needed for muscle spasms. 09/03/21   Orma Render, NP  escitalopram (LEXAPRO) 20 MG tablet Take 1 tablet (20 mg total) by mouth daily. 09/03/21   Orma Render, NP  gabapentin (NEURONTIN) 100 MG capsule Take 1 capsule (100 mg total) by mouth 3 (three) times daily. 09/03/21   Orma Render, NP  Melatonin 3 MG CAPS Take by mouth. 09/27/20   [provider]  meloxicam (MOBIC) 15 MG tablet Take 1 tablet (15 mg total) by mouth daily. 09/03/21   Orma Render, NP  oxyCODONE-acetaminophen (PERCOCET) 5-325 MG tablet Take 1 tablet by mouth every 8 (eight) hours as needed. 01/28/22   Mesner, Corene Cornea, MD  promethazine-dextromethorphan (PROMETHAZINE-DM) 6.25-15 MG/5ML syrup Take 2.5 mLs by mouth 3 (three) times daily as needed for cough. 05/06/22   Jaynee Eagles, PA-C  propranolol (INDERAL) 20 MG tablet Take 1 tablet (20 mg total) by mouth 2 (two) times daily. 09/03/21   Orma Render, NP  pseudoephedrine (SUDAFED) 60 MG tablet Take 1 tablet (60 mg total) by mouth every 8 (eight) hours as needed for congestion. 05/06/22   Jaynee Eagles, PA-C      Allergies    Banana and Rizatriptan    Review of Systems   Review of Systems  Gastrointestinal:  Positive for abdominal pain.  Genitourinary:  Positive for vaginal bleeding.  Neurological:  Positive for dizziness.    Physical Exam Updated Vital Signs BP 123/81 (BP Location: Right Arm)   Pulse 79   Temp 98.3 F (36.8 C) (Oral)   Resp 17   Ht 5' 1"$  (1.549 m)   Wt 87.5 kg   SpO2 98%   BMI 36.47 kg/m  Physical Exam Vitals and nursing note reviewed.  Constitutional:      Appearance: Normal appearance.  HENT:     Head: Normocephalic and atraumatic.     Mouth/Throat:     Mouth: Mucous membranes are moist.  Eyes:     General: No scleral icterus. Cardiovascular:     Rate and Rhythm: Normal rate and regular rhythm.     Pulses: Normal pulses.     Heart sounds: Normal heart sounds.   Pulmonary:     Effort: Pulmonary effort is normal.     Breath sounds: Normal breath sounds.  Abdominal:     General: Abdomen is flat.     Palpations: Abdomen is soft.     Tenderness: There is abdominal tenderness in the suprapubic area.  Musculoskeletal:        General: No deformity.  Skin:    General: Skin is warm.     Findings: No rash.  Neurological:     General: No focal deficit present.     Mental Status: She is alert.  Psychiatric:        Mood and Affect: Mood normal.     ED Results / Procedures / Treatments   Labs (all labs ordered are listed, but only abnormal results are displayed) Labs Reviewed  CBC  I-STAT BETA HCG BLOOD, ED ([facility], [facility], [facility] ONLY)    EKG None  Radiology US Transvaginal Non-OB  Result Date: 06/13/2022 CLINICAL DATA:  Vaginal bleeding EXAM: TRANSVAGINAL ULTRASOUND OF PELVIS DOPPLER ULTRASOUND OF OVARIES TECHNIQUE: Transvaginal ultrasound examination of the pelvis was performed including evaluation of the uterus, ovaries, adnexal regions, and pelvic cul-de-sac. Color and duplex Doppler ultrasound was utilized to evaluate blood flow to the ovaries. COMPARISON:  None Available. FINDINGS: Uterus Measurements: 7.2 x 3.7 x 4.4 cm = volume: 54.8 mL. No fibroids or other mass visualized. Endometrium Thickness: 5 mm.  No focal abnormality visualized. Right ovary Measurements: 3.2 x 2 x 2.3 cm = volume: 7.4 mL. Normal appearance/no adnexal mass. Left ovary Measurements: 3.2 x 2.3 x 2.3 cm = volume: 8.9 mL. Normal appearance/no adnexal mass. Pulsed Doppler evaluation demonstrates normal low-resistance arterial and venous waveforms in both ovaries. Other findings:  No abnormal free fluid IMPRESSION: Negative pelvic ultrasound. No evidence for ovarian torsion. Electronically Signed   By: Donavan Foil M.D.   On: 06/13/2022 19:35   [place] Art/Ven Flow Abd Pelv Doppler  Result Date: 06/13/2022 CLINICAL DATA:  Vaginal bleeding EXAM: TRANSVAGINAL ULTRASOUND OF PELVIS DOPPLER  ULTRASOUND OF OVARIES TECHNIQUE: Transvaginal ultrasound examination of the pelvis was performed including evaluation of the uterus, ovaries, adnexal regions, and pelvic cul-de-sac. Color and duplex Doppler ultrasound was utilized to evaluate blood flow to the ovaries. COMPARISON:  None Available. FINDINGS: Uterus Measurements: 7.2 x 3.7 x 4.4 cm = volume: 54.8 mL. No fibroids or other mass visualized. Endometrium Thickness: 5 mm.  No focal abnormality visualized. Right ovary Measurements: 3.2 x 2 x 2.3 cm = volume: 7.4 mL. Normal appearance/no adnexal mass. Left ovary Measurements: 3.2 x 2.3 x 2.3 cm = volume: 8.9 mL. Normal appearance/no adnexal mass. Pulsed Doppler evaluation demonstrates normal low-resistance arterial and  venous waveforms in both ovaries. Other findings:  No abnormal free fluid IMPRESSION: Negative pelvic ultrasound. No evidence for ovarian torsion. Electronically Signed   By: Donavan Foil M.D.   On: 06/13/2022 19:35    Procedures Procedures    Medications Ordered in ED Medications  ibuprofen (ADVIL) tablet 600 mg (600 mg Oral Given 06/13/22 1931)  ondansetron (ZOFRAN-ODT) disintegrating tablet 4 mg (4 mg Oral Given 06/13/22 1931)    ED Course/ Medical Decision Making/ A&P                             Medical Decision Making Amount and/or Complexity of Data Reviewed Labs: ordered. Radiology: ordered.  Risk Prescription drug management.   This patient presents to the ED for vaginal bleeding, this involves an extensive number of treatment options, and is a complaint that carries with a high risk of complications and morbidity.  The differential diagnosis includes laceration, leiomyoma, adenomyosis, coagulopathy, obvious tori dysfunction, endometrial, iatrogenic.  This is not an exhaustive list.  Lab tests: I ordered and personally interpreted labs.  The pertinent results include: WBC unremarkable. Hbg unremarkable. Platelets unremarkable.  Imaging studies: I ordered  imaging studies. I personally reviewed, interpreted imaging and agree with the radiologist's interpretations. The results include: Ultrasound normal.  Problem list/ ED course/ Critical interventions/ Medical management: HPI: See above Vital signs within normal range and stable throughout visit. Laboratory/imaging studies significant for: See above. On physical examination, patient is afebrile and appears in no acute distress. Patient presents with vaginal bleeding likely secondary to iatrogenic post IUD removal yesterday or other non-emergent cause of abnormal uterine bleeding such as anovulatory cycle. Based on history, exam, and ED Workup patient's presentation not consistent with ectopic pregnancy, molar pregnancy, life-threatening coagulopathy, trauma, serious bacterial infection. Patient will follow up with OBGYN for further evaluation and management. I have reviewed the patient home medicines and have made adjustments as needed.  Cardiac monitoring/EKG: The patient was maintained on a cardiac monitor.  I personally reviewed and interpreted the cardiac monitor which showed an underlying rhythm of: sinus rhythm.  Additional history obtained: External records from outside source obtained and reviewed including: Chart review including previous notes, labs, imaging.  Disposition Continued outpatient therapy. Follow-up with OBGYN recommended for reevaluation of symptoms. Treatment plan discussed with patient.  Pt acknowledged understanding was agreeable to the plan. Worrisome signs and symptoms were discussed with patient, and patient acknowledged understanding to return to the ED if they noticed these signs and symptoms. Patient was stable upon discharge.   This chart was dictated using voice recognition software.  Despite best efforts to proofread,  errors can occur which can change the documentation meaning.          Final Clinical Impression(s) / ED Diagnoses Final diagnoses:   Vaginal bleeding    Rx / DC Orders ED Discharge Orders          Ordered    naproxen (NAPROSYN) 500 MG tablet  2 times daily        06/13/22 2034              Rex Kras, PA 06/14/22 [id number]    Audley Hose, MD 06/15/22 [id number]

## 2022-06-13 NOTE — Discharge Instructions (Addendum)
Your workup was reassuring today. Please take tylenol/ibuprofen for pain. I recommend close follow-up with OBGYN for reevaluation.  Please do not hesitate to return to emergency department if worrisome signs symptoms we discussed become apparent.

## 2022-07-08 ENCOUNTER — Encounter (HOSPITAL_COMMUNITY): Payer: Self-pay

## 2022-07-08 ENCOUNTER — Ambulatory Visit (HOSPITAL_COMMUNITY)
Admission: EM | Admit: 2022-07-08 | Discharge: 2022-07-08 | Disposition: A | Discharge status: Home / Self Care | Payer: No Typology Code available for payment source | Attending: Family Medicine | Admitting: Family Medicine

## 2022-07-08 DIAGNOSIS — Z1152 Encounter for screening for COVID-19: Secondary | ICD-10-CM | POA: Diagnosis not present

## 2022-07-08 DIAGNOSIS — R059 Cough, unspecified: Secondary | ICD-10-CM | POA: Insufficient documentation

## 2022-07-08 DIAGNOSIS — J069 Acute upper respiratory infection, unspecified: Secondary | ICD-10-CM

## 2022-07-08 DIAGNOSIS — J111 Influenza due to unidentified influenza virus with other respiratory manifestations: Secondary | ICD-10-CM | POA: Insufficient documentation

## 2022-07-08 LAB — POC INFLUENZA A AND B ANTIGEN (URGENT CARE ONLY)
INFLUENZA A ANTIGEN, POC: NEGATIVE
INFLUENZA B ANTIGEN, POC: NEGATIVE

## 2022-07-08 LAB — SARS CORONAVIRUS 2 (TAT 6-24 HRS): SARS Coronavirus 2: NEGATIVE

## 2022-07-08 MED ORDER — OSELTAMIVIR PHOSPHATE 75 MG PO CAPS: 75.0000 mg | ORAL_CAPSULE | Freq: Two times a day (BID) | ORAL | 0 refills | Status: AC

## 2022-07-08 NOTE — ED Triage Notes (Signed)
Pt is here for cough, body aches, chills, nausea, fever , nasal congestion, runny nose bilateral ear pain , neck pain, back pain , headache, fatigue x 1day

## 2022-07-08 NOTE — Discharge Instructions (Signed)
The flu test is negative; I still want to treat for influenza-like illness  Take oseltamivir 75 mg--1 capsule 2 times daily for 5 days; this is for potential flu, and if it is the flu, can help you feel better a little faster.   Your swab for COVID today, and staff will notify you if it is positive.  If it is positive, then please stop the oseltamivir that I have sent in

## 2022-07-08 NOTE — ED Provider Notes (Signed)
[place]    CSN: [id number] Arrival date & time: 07/08/22  [id number]      History   Chief Complaint Chief Complaint  Patient presents with   Cough   Fever    HPI Erika Durham is a 27 y.o. female.    Cough Associated symptoms: fever   Fever Associated symptoms: cough    Here for fever, cough, sore throat, and nasal congestion.  She is also had a good bit of myalgia and malaise.  She has had some nausea but no vomiting or diarrhea.  Symptoms began yesterday.  She has been exposed to a coworker who works in a nearby cubicle who did test positive for the flu.  Patient requests a flu test.  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      Home Medications    Prior to Admission medications   Medication Sig Start Date End Date Taking? Authorizing Provider  albuterol (VENTOLIN HFA) 108 (90 Base) MCG/ACT inhaler Inhale 1-2 puffs into the lungs every 6 (six) hours as needed for wheezing or shortness of breath. 05/06/22  Yes Jaynee Eagles, PA-C  escitalopram (LEXAPRO) 20 MG tablet Take 1 tablet (20 mg total) by mouth daily. 09/03/21  Yes Early, Coralee Pesa, NP  gabapentin (NEURONTIN) 100 MG capsule Take 1 capsule (100 mg total) by mouth 3 (three) times daily. 09/03/21  Yes Early, Coralee Pesa, NP  meloxicam (MOBIC) 15 MG tablet Take 1 tablet (15 mg total) by mouth daily. 09/03/21  Yes Early, Coralee Pesa, NP  naproxen (NAPROSYN) 500 MG  tablet Take 1 tablet (500 mg total) by mouth 2 (two) times daily. 06/13/22  Yes Rex Kras, PA  oseltamivir (TAMIFLU) 75 MG capsule Take 1 capsule (75 mg total) by mouth every 12 (twelve) hours. 07/08/22  Yes Barrett Henle, MD  propranolol (INDERAL) 20 MG tablet Take 1 tablet (20 mg total) by mouth 2 (two) times daily. 09/03/21  Yes Early, Coralee Pesa, NP  clonazePAM (KLONOPIN) 0.5 MG tablet Take 0.5 tablets (0.25 mg total) by mouth daily as needed for anxiety. 09/03/21   Orma Render, NP  cyclobenzaprine (FLEXERIL) 5 MG tablet Take 1 tablet (5 mg total) by mouth 3 (three) times daily as needed for muscle spasms. 09/03/21   Orma Render, NP  Melatonin 3 MG CAPS Take by mouth. 09/27/20   [provider]    Family History Family History  Problem Relation Age of Onset   Healthy Mother    Anxiety disorder Mother    Healthy Father    Depression Sister    Anxiety disorder Sister     Social History Social History   Tobacco Use   Smoking status: Never   Smokeless tobacco: Never  Vaping Use   Vaping Use: Never used  Substance Use Topics   Alcohol use: Yes    Comment:  socially   Drug use: Yes    Types: Marijuana    Comment: socially     Allergies   Banana and Rizatriptan   Review of Systems Review of Systems  Constitutional:  Positive for fever.  Respiratory:  Positive for cough.      Physical Exam Triage Vital Signs ED Triage Vitals  Enc Vitals Group     BP 07/08/22 0935 109/82     Pulse Rate 07/08/22 0935 98     Resp 07/08/22 0935 16     Temp 07/08/22 0935 98.1 F (36.7 C)     Temp Source 07/08/22 0935 Oral     SpO2 07/08/22 0935 97 %     Weight --      Height --      Head Circumference --      Peak Flow --      Pain Score 07/08/22 0936 5     Pain Loc --      Pain Edu? --      Excl. in [place]? --    No data found.  Updated Vital Signs BP 109/82 (BP Location: Left Arm)   Pulse 98   Temp 98.1 F (36.7 C) (Oral)   Resp 16   LMP 06/12/2022   SpO2 97%   Visual  Acuity Right Eye Distance:   Left Eye Distance:   Bilateral Distance:    Right Eye Near:   Left Eye Near:    Bilateral Near:     Physical Exam Vitals reviewed.  Constitutional:      General: She is not in acute distress.    Appearance: She is not ill-appearing, toxic-appearing or diaphoretic.  HENT:     Right Ear: Tympanic membrane and ear canal normal.     Left Ear: Tympanic membrane and ear canal normal.     Nose: Nose normal.     Mouth/Throat:     Mouth: Mucous membranes are moist.     Pharynx: No oropharyngeal exudate or posterior oropharyngeal erythema.  Eyes:     Extraocular Movements: Extraocular movements intact.     Conjunctiva/sclera: Conjunctivae normal.     Pupils: Pupils are equal, round, and reactive to light.  Cardiovascular:     Rate and Rhythm: Normal rate and regular rhythm.     Heart sounds: No murmur heard. Pulmonary:     Effort: Pulmonary effort is normal. No respiratory distress.     Breath sounds: No stridor. No wheezing, rhonchi or rales.  Musculoskeletal:     Cervical back: Neck supple.  Lymphadenopathy:     Cervical: No cervical adenopathy.  Skin:    Capillary Refill: Capillary refill takes less than 2 seconds.     Coloration: Skin is not jaundiced or pale.  Neurological:     General: No focal deficit present.     Mental Status: She is alert and oriented to person, place, and time.  Psychiatric:        Behavior: Behavior normal.      UC Treatments / Results  Labs (all labs ordered are listed, but only abnormal results are displayed) Labs Reviewed  POC INFLUENZA A AND B ANTIGEN (URGENT CARE ONLY)    EKG   Radiology No results found.  Procedures Procedures (including critical care time)  Medications Ordered in UC Medications - No data to display  Initial Impression / Assessment and Plan / UC Course  I have reviewed the triage vital signs and the nursing notes.  Pertinent labs & imaging results that  were available during my  care of the patient were reviewed by me and considered in my medical decision making (see chart for details).        Influenza test is negative.  I discussed with patient the feelings of the rapid flu test, and I have still sent in treatment for influenza-like illness.  COVID swab is done today; if positive she will know she needs to quarantine Final Clinical Impressions(s) / UC Diagnoses   Final diagnoses:  Viral upper respiratory tract infection  Influenza-like illness     Discharge Instructions      The flu test is negative; I still want to treat for influenza-like illness  Take oseltamivir 75 mg--1 capsule 2 times daily for 5 days; this is for potential flu, and if it is the flu, can help you feel better a little faster.   Your swab for COVID today, and staff will notify you if it is positive.  If it is positive, then please stop the oseltamivir that I have sent in     ED Prescriptions     Medication Sig Dispense Auth. Provider   oseltamivir (TAMIFLU) 75 MG capsule Take 1 capsule (75 mg total) by mouth every 12 (twelve) hours. 10 capsule Barrett Henle, MD      PDMP not reviewed this encounter.   Barrett Henle, MD 07/08/22 1034

## 2022-07-09 ENCOUNTER — Telehealth (HOSPITAL_COMMUNITY): Payer: Self-pay | Admitting: *Deleted

## 2022-07-09 ENCOUNTER — Emergency Department (HOSPITAL_COMMUNITY)
Admission: EM | Admit: 2022-07-09 | Discharge: 2022-07-10 | Disposition: A | Discharge status: Home / Self Care | Payer: No Typology Code available for payment source | Attending: Emergency Medicine | Admitting: Emergency Medicine

## 2022-07-09 ENCOUNTER — Other Ambulatory Visit: Payer: Self-pay

## 2022-07-09 ENCOUNTER — Encounter (HOSPITAL_COMMUNITY): Payer: Self-pay

## 2022-07-09 DIAGNOSIS — J101 Influenza due to other identified influenza virus with other respiratory manifestations: Secondary | ICD-10-CM | POA: Insufficient documentation

## 2022-07-09 DIAGNOSIS — Z1152 Encounter for screening for COVID-19: Secondary | ICD-10-CM | POA: Insufficient documentation

## 2022-07-09 DIAGNOSIS — R059 Cough, unspecified: Secondary | ICD-10-CM | POA: Diagnosis present

## 2022-07-09 LAB — I-STAT BETA HCG BLOOD, ED (MC, WL, AP ONLY): I-stat hCG, quantitative: <5 m[IU]/mL (ref <5)

## 2022-07-09 LAB — CBC
HCT: 42.5 % (ref 36.0–46.0)
Hemoglobin: 14.5 g/dL (ref 12.0–15.0)
MCH: 30.1 pg (ref 26.0–34.0)
MCHC: 34.1 g/dL (ref 30.0–36.0)
MCV: 88.2 fL (ref 80.0–100.0)
Platelets: 280 10*3/uL (ref 150–400)
RBC: 4.82 MIL/uL (ref 3.87–5.11)
RDW: 13.5 % (ref 11.5–15.5)
WBC: 7.4 10*3/uL (ref 4.0–10.5)
nRBC: 0 % (ref 0.0–0.2)

## 2022-07-09 LAB — RESP PANEL BY RT-PCR (RSV, FLU A&B, COVID)  RVPGX2
Influenza A by PCR: POSITIVE — AB
Influenza B by PCR: NEGATIVE
Resp Syncytial Virus by PCR: NEGATIVE
SARS Coronavirus 2 by RT PCR: NEGATIVE

## 2022-07-09 LAB — COMPREHENSIVE METABOLIC PANEL
ALT: 23 U/L (ref 0–44)
AST: 36 U/L (ref 15–41)
Albumin: 3.8 g/dL (ref 3.5–5.0)
Alkaline Phosphatase: 30 U/L — ABNORMAL LOW (ref 38–126)
Anion gap: 9 (ref 5–15)
BUN: 6 mg/dL (ref 6–20)
CO2: 25 mmol/L (ref 22–32)
Calcium: 8.8 mg/dL — ABNORMAL LOW (ref 8.9–10.3)
Chloride: 100 mmol/L (ref 98–111)
Creatinine, Ser: 0.9 mg/dL (ref 0.44–1.00)
GFR, Estimated: >60 mL/min (ref >60)
Glucose, Bld: 88 mg/dL (ref 70–99)
Potassium: 3.7 mmol/L (ref 3.5–5.1)
Sodium: 134 mmol/L — ABNORMAL LOW (ref 135–145)
Total Bilirubin: 0.8 mg/dL (ref 0.3–1.2)
Total Protein: 7.4 g/dL (ref 6.5–8.1)

## 2022-07-09 LAB — URINALYSIS, ROUTINE W REFLEX MICROSCOPIC
Bilirubin Urine: NEGATIVE
Glucose, UA: NEGATIVE mg/dL
Hgb urine dipstick: NEGATIVE
Ketones, ur: 20 mg/dL — AB
Leukocytes,Ua: NEGATIVE
Nitrite: NEGATIVE
Protein, ur: NEGATIVE mg/dL
Specific Gravity, Urine: 1.026 (ref 1.005–1.030)
pH: 6 (ref 5.0–8.0)

## 2022-07-09 LAB — LIPASE, BLOOD: Lipase: 34 U/L (ref 11–51)

## 2022-07-09 MED ORDER — ONDANSETRON HCL 4 MG/2ML IJ SOLN
4.0000 mg | Freq: Once | INTRAMUSCULAR | Status: AC
  Administered 2022-07-10: 4 mg via INTRAVENOUS
  Filled 2022-07-09: qty 2

## 2022-07-09 MED ORDER — SODIUM CHLORIDE 0.9 % IV BOLUS
1000.0000 mL | Freq: Once | INTRAVENOUS | Status: AC
  Administered 2022-07-10: 1000 mL via INTRAVENOUS

## 2022-07-09 MED ORDER — ALBUTEROL SULFATE HFA 108 (90 BASE) MCG/ACT IN AERS
1.0000 | INHALATION_SPRAY | Freq: Once | RESPIRATORY_TRACT | Status: AC
  Administered 2022-07-09: 2 via RESPIRATORY_TRACT
  Filled 2022-07-09: qty 6.7

## 2022-07-09 MED ORDER — ACETAMINOPHEN 325 MG PO TABS
650.0000 mg | ORAL_TABLET | Freq: Once | ORAL | Status: AC
  Administered 2022-07-09: 650 mg via ORAL
  Filled 2022-07-09: qty 2

## 2022-07-09 MED ORDER — ONDANSETRON 4 MG PO TBDP
4.0000 mg | ORAL_TABLET | Freq: Once | ORAL | Status: AC
  Administered 2022-07-09: 4 mg via ORAL
  Filled 2022-07-09: qty 1

## 2022-07-09 MED ORDER — IBUPROFEN 400 MG PO TABS
600.0000 mg | ORAL_TABLET | Freq: Once | ORAL | Status: AC
  Administered 2022-07-09: 600 mg via ORAL
  Filled 2022-07-09: qty 1

## 2022-07-09 NOTE — ED Triage Notes (Signed)
Pt states went to UC yesterday and her flu was neg, but they gave her tamiflu anyway, because the provider felt the flu was a false neg. Pt c/o fever the highest 103. Pt states took dayquil an hour ago. Pt c/o vomiting, productive cough w/clear phlegm. Pt states has vomited 7 times in the last 24 hrs. Pt c/o bodyaches.

## 2022-07-09 NOTE — ED Provider Triage Note (Signed)
Emergency Medicine Provider Triage Evaluation Note  Erika Durham , a 27 y.o. female  was evaluated in triage.  Pt complains of cough and vomiting.  Patient reports that she has had the symptoms present for several days and was recently seen at urgent care yesterday but was flu and COVID-negative.  She described Tamiflu regardless and start taking this medication but has not had any improvement in her symptoms.  Reports a fever of 103 degrees and patient tried to take DayQuil prior to arrival emergency department but vomited.  Reports she vomited approximately 7 times in the last 24 hours.  Concerned that she is dehydrated she is unable to tolerate any oral intake.  Review of Systems  Positive: As above Negative: As above  Physical Exam  BP (!) 120/100 (BP Location: Left Arm)   Pulse (!) 116   Temp (!) 102.9 F (39.4 C)   Resp 18   Ht '5\' 1"'$  (1.549 m)   Wt 87.5 kg   LMP 06/12/2022   SpO2 96%   BMI 36.45 kg/m  Gen:   Awake, uncomfortable Resp:  Normal effort  MSK:   Moves extremities without difficulty  Other:    Medical Decision Making  Medically screening exam initiated at 8:50 PM.  Appropriate orders placed.  Erika Durham was informed that the remainder of the evaluation will be completed by another provider, this initial triage assessment does not replace that evaluation, and the importance of remaining in the ED until their evaluation is complete.     Erika Heller, PA-C 07/09/22 2051

## 2022-07-10 LAB — GROUP A STREP BY PCR: Group A Strep by PCR: NOT DETECTED

## 2022-07-10 MED ORDER — ONDANSETRON HCL 4 MG PO TABS: 4.0000 mg | ORAL_TABLET | Freq: Four times a day (QID) | ORAL | 0 refills | Status: AC

## 2022-07-10 NOTE — ED Provider Notes (Signed)
[facility] Provider Note   CSN: [id number] Arrival date & time: 07/09/22  1847     History  Chief Complaint  Patient presents with   Cough   Fever   Emesis    Erika Durham is a 27 y.o. female.  HPI   Patient without significant medical history presenting with complaints of URI-like symptoms.  She endorses fevers chills cough congestion chest tightness wheezing nausea vomiting general body aches.  Patient states that her coworker tested positive for the flu and she was in close contact with them, she states that she went to the urgent care yesterday where she had a negative flu and COVID test, but she was started on the antivirals for the flu.  She states that since then she had worsening nausea vomiting, unable to hold anything down, and then developed a fever of 103 with prompted her to come in.  She denies any calf pain leg swelling, no history of PEs or DVTs, she is on oral birth control.  She has no other complaints.    Home Medications Prior to Admission medications   Medication Sig Start Date End Date Taking? Authorizing Provider  ondansetron (ZOFRAN) 4 MG tablet Take 1 tablet (4 mg total) by mouth every 6 (six) hours. 07/10/22  Yes Marcello Fennel, PA-C  albuterol (VENTOLIN HFA) 108 (90 Base) MCG/ACT inhaler Inhale 1-2 puffs into the lungs every 6 (six) hours as needed for wheezing or shortness of breath. 05/06/22   Jaynee Eagles, PA-C  clonazePAM (KLONOPIN) 0.5 MG tablet Take 0.5 tablets (0.25 mg total) by mouth daily as needed for anxiety. 09/03/21   Orma Render, NP  cyclobenzaprine (FLEXERIL) 5 MG tablet Take 1 tablet (5 mg total) by mouth 3 (three) times daily as needed for muscle spasms. 09/03/21   Orma Render, NP  escitalopram (LEXAPRO) 20 MG tablet Take 1 tablet (20 mg total) by mouth daily. 09/03/21   Orma Render, NP  gabapentin (NEURONTIN) 100 MG capsule Take 1 capsule (100 mg total) by mouth 3 (three) times daily. 09/03/21    Orma Render, NP  Melatonin 3 MG CAPS Take by mouth. 09/27/20   [provider]  meloxicam (MOBIC) 15 MG tablet Take 1 tablet (15 mg total) by mouth daily. 09/03/21   Orma Render, NP  naproxen (NAPROSYN) 500 MG tablet Take 1 tablet (500 mg total) by mouth 2 (two) times daily. 06/13/22   Rex Kras, PA  oseltamivir (TAMIFLU) 75 MG capsule Take 1 capsule (75 mg total) by mouth every 12 (twelve) hours. 07/08/22   Barrett Henle, MD  propranolol (INDERAL) 20 MG tablet Take 1 tablet (20 mg total) by mouth 2 (two) times daily. 09/03/21   Orma Render, NP      Allergies    Banana and Rizatriptan    Review of Systems   Review of Systems  Constitutional:  Positive for chills and fever.  HENT:  Positive for congestion.   Respiratory:  Positive for cough and chest tightness. Negative for shortness of breath.   Cardiovascular:  Negative for chest pain.  Gastrointestinal:  Negative for abdominal pain.  Neurological:  Negative for headaches.    Physical Exam Updated Vital Signs BP 121/74   Pulse 100   Temp 99.3 F (37.4 C) (Oral)   Resp 19   Ht '5\' 1"'$  (1.549 m)   Wt 87.5 kg   LMP 06/12/2022   SpO2 100%   BMI 36.45  kg/m  Physical Exam Vitals and nursing note reviewed.  Constitutional:      General: She is not in acute distress.    Appearance: She is not ill-appearing.  HENT:     Head: Normocephalic and atraumatic.     Right Ear: Tympanic membrane, ear canal and external ear normal.     Left Ear: Tympanic membrane, ear canal and external ear normal.     Nose: Congestion present.     Mouth/Throat:     Mouth: Mucous membranes are moist.     Pharynx: Oropharynx is clear. Posterior oropharyngeal erythema present.     Comments: No trismus no torticollis tongue uvula both midline controlling oral secretions tonsils both equal symmetric bilaterally, there is noted erythema as well as cobblestoning the posterior pharynx, no submandibular swelling, no muffled tone voice Eyes:      Conjunctiva/sclera: Conjunctivae normal.  Cardiovascular:     Rate and Rhythm: Normal rate and regular rhythm.     Pulses: Normal pulses.     Heart sounds: No murmur heard.    No friction rub. No gallop.  Pulmonary:     Effort: No respiratory distress.     Breath sounds: No wheezing, rhonchi or rales.  Musculoskeletal:     Right lower leg: No edema.     Left lower leg: No edema.     Comments: No unilateral leg swelling no calf tenderness no palpable cords.  Skin:    General: Skin is warm and dry.  Neurological:     Mental Status: She is alert.  Psychiatric:        Mood and Affect: Mood normal.     ED Results / Procedures / Treatments   Labs (all labs ordered are listed, but only abnormal results are displayed) Labs Reviewed  RESP PANEL BY RT-PCR (RSV, FLU A&B, COVID)  RVPGX2 - Abnormal; Notable for the following components:      Result Value   Influenza A by PCR POSITIVE (*)    All other components within normal limits  COMPREHENSIVE METABOLIC PANEL - Abnormal; Notable for the following components:   Sodium 134 (*)    Calcium 8.8 (*)    Alkaline Phosphatase 30 (*)    All other components within normal limits  URINALYSIS, ROUTINE W REFLEX MICROSCOPIC - Abnormal; Notable for the following components:   APPearance HAZY (*)    Ketones, ur 20 (*)    All other components within normal limits  GROUP A STREP BY PCR  LIPASE, BLOOD  CBC  I-STAT BETA HCG BLOOD, ED ([facility], [facility], [facility] ONLY)    EKG None  Radiology No results found.  Procedures Procedures    Medications Ordered in ED Medications  ondansetron (ZOFRAN-ODT) disintegrating tablet 4 mg (4 mg Oral Given 07/09/22 2059)  acetaminophen (TYLENOL) tablet 650 mg (650 mg Oral Given 07/09/22 2059)  sodium chloride 0.9 % bolus 1,000 mL (0 mLs Intravenous Stopped 07/10/22 0054)  ondansetron (ZOFRAN) injection 4 mg (4 mg Intravenous Given 07/10/22 0000)  ibuprofen (ADVIL) tablet 600 mg (600 mg Oral Given 07/09/22 2341)  albuterol  (VENTOLIN HFA) 108 (90 Base) MCG/ACT inhaler 1-2 puff (2 puffs Inhalation Given 07/09/22 2353)    ED Course/ Medical Decision Making/ A&P                             Medical Decision Making Amount and/or Complexity of Data Reviewed Labs: ordered.  Risk Prescription drug management.   This patient  presents to the ED for concern of cough congestion, this involves an extensive number of treatment options, and is a complaint that carries with it a high risk of complications and morbidity.  The differential diagnosis includes viral URI, pneumonia, PE,    Additional history obtained:  Additional history obtained from N/A External records from outside source obtained and reviewed including urgent care notes   Co morbidities that complicate the patient evaluation  N/A  Social Determinants of Health:  N/A    Lab Tests:  I Ordered, and personally interpreted labs.  The pertinent results include: CBC is unremarkable, CMP shows sodium of 134, calcium 8.8, lipase 34, UA shows 20 ketones, i-STAT hCG negative, respiratory panel positive for influenza A   Imaging Studies ordered:  I ordered imaging studies including chest x-ray I independently visualized and interpreted imaging which showed acute findings I agree with the radiologist interpretation   Cardiac Monitoring:  The patient was maintained on a cardiac monitor.  I personally viewed and interpreted the cardiac monitored which showed an underlying rhythm of: N/A   Medicines ordered and prescription drug management:  I ordered medication including fluids, bronchodilators, Zofran, I have reviewed the patients home medicines and have made adjustments as needed  Critical Interventions:  N/a   Reevaluation:  Presents with URI-like symptoms, triage obtain basic lab work imaging which I personally reviewed, unremarkable, my exam patient was resting comfortably, with no obvious complaints, will provide her with fluids,  Tylenol and bronchodilators and reassess.    Consultations Obtained:  N/a    Test Considered:  N/a    Rule out Low suspicion for systemic infection as patient is nontoxic-appearing, vital signs reassuring, no obvious source infection noted on exam.  Low suspicion for pneumonia as lung sounds are clear bilaterally, x-ray did not reveal any acute findings.  Suspicion for PE is low at this time, presentation is atypical of etiology, seems more consistent with influenza infection.  Patient did present tachycardic with a fever endorses some chest tightness but she tested positive for influenza she is more consistent with this presentation, she had no unilateral leg swelling no calf tenderness, O2 sats remained stable she is nontachypneic nonhypoxic.  Low suspicion for strep throat strep is negative low suspicion patient would need  hospitalized due to viral infection or Covid as vital signs reassuring, patient is not in respiratory distress.      Dispostion and problem list  After consideration of the diagnostic results and the patients response to treatment, I feel that the patent would benefit from discharge.  Influenza-currently receiving the antiviral treatment, I explained that this might be worsening her nausea and vomiting, I explained that it might be worth discontinuing this.  Will provide her with Zofran, recommend over-the-counter pain medications, follow-up PCP as needed strict return precautions.            Final Clinical Impression(s) / ED Diagnoses Final diagnoses:  Influenza A    Rx / DC Orders ED Discharge Orders          Ordered    ondansetron (ZOFRAN) 4 MG tablet  Every 6 hours        07/10/22 0138              Marcello Fennel, PA-C 07/10/22 0139    Quintella Reichert, MD 07/10/22 [phone]

## 2022-07-10 NOTE — ED Notes (Signed)
Patient verbalizes understanding of discharge instructions. Opportunity for questioning and answers were provided. Armband removed by staff, pt discharged from ED. Pt ambulatory to ED waiting room with steady gait.  

## 2022-07-10 NOTE — Discharge Instructions (Signed)
You have the flu, recommend over-the-counter pain medications like ibuprofen Tylenol for fever and pain control, nasal decongestions like Flonase and Zyrtec, Mucinex for cough.  If not eating recommend supplementing with Gatorade to help with electrolyte supplementation.  Given you Zofran please take as prescribed.  Follow-up PCP for further evaluation.  Come back to the emergency department if you develop chest pain, shortness of breath, severe abdominal pain, uncontrolled nausea, vomiting, diarrhea.

## 2022-08-19 ENCOUNTER — Telehealth: Payer: No Typology Code available for payment source | Admitting: Nurse Practitioner

## 2022-08-19 DIAGNOSIS — N3000 Acute cystitis without hematuria: Secondary | ICD-10-CM | POA: Diagnosis not present

## 2022-08-19 MED ORDER — CEPHALEXIN 500 MG PO CAPS: 500.0000 mg | ORAL_CAPSULE | Freq: Two times a day (BID) | ORAL | 0 refills | Status: AC

## 2022-08-19 NOTE — Progress Notes (Signed)

## 2022-09-17 ENCOUNTER — Other Ambulatory Visit (HOSPITAL_BASED_OUTPATIENT_CLINIC_OR_DEPARTMENT_OTHER): Payer: Self-pay | Admitting: Nurse Practitioner

## 2022-09-17 DIAGNOSIS — G8929 Other chronic pain: Secondary | ICD-10-CM

## 2022-09-17 DIAGNOSIS — F322 Major depressive disorder, single episode, severe without psychotic features: Secondary | ICD-10-CM

## 2022-09-17 DIAGNOSIS — M25551 Pain in right hip: Secondary | ICD-10-CM

## 2022-09-17 NOTE — Telephone Encounter (Signed)
Refill request pts. Last apt 09/03/21 I sent pt. A My Chart message to please call and schedule an apt.

## 2022-10-09 ENCOUNTER — Telehealth: Payer: No Typology Code available for payment source | Admitting: Nurse Practitioner

## 2022-10-09 DIAGNOSIS — R300 Dysuria: Secondary | ICD-10-CM | POA: Diagnosis not present

## 2022-10-09 MED ORDER — NITROFURANTOIN MONOHYD MACRO 100 MG PO CAPS: 100.0000 mg | ORAL_CAPSULE | Freq: Two times a day (BID) | ORAL | 0 refills | Status: AC

## 2022-10-09 NOTE — Progress Notes (Signed)
E-Visit for Urinary Problems  We are sorry that you are not feeling well.  Here is how we plan to help!  Please note that this is your second time being treated virtually for a UTI, if you have symptoms again in the near future we will advise you are seen in person for urine testing to assure you are getting the best care. We will treat you today with this plan:  Based on what you shared with me it looks like you most likely have a simple urinary tract infection.  A UTI (Urinary Tract Infection) is a bacterial infection of the bladder.  Most cases of urinary tract infections are simple to treat but a key part of your care is to encourage you to drink plenty of fluids and watch your symptoms carefully.  I have prescribed MacroBid 100 mg twice a day for 5 days.  Your symptoms should gradually improve. Call us if the burning in your urine worsens, you develop worsening fever, back pain or pelvic pain or if your symptoms do not resolve after completing the antibiotic.  Urinary tract infections can be prevented by drinking plenty of water to keep your body hydrated.  Also be sure when you wipe, wipe from front to back and don't hold it in!  If possible, empty your bladder every 4 hours.  HOME CARE Drink plenty of fluids Compete the full course of the antibiotics even if the symptoms resolve Remember, when you need to go.go. Holding in your urine can increase the likelihood of getting a UTI! GET HELP RIGHT AWAY IF: You cannot urinate You get a high fever Worsening back pain occurs You see blood in your urine You feel sick to your stomach or throw up You feel like you are going to pass out  MAKE SURE YOU  Understand these instructions. Will watch your condition. Will get help right away if you are not doing well or get worse.   Thank you for choosing an e-visit.  Your e-visit answers were reviewed by a board certified advanced clinical practitioner to complete your personal care plan.  Depending upon the condition, your plan could have included both over the counter or prescription medications.  Please review your pharmacy choice. Make sure the pharmacy is open so you can pick up prescription now. If there is a problem, you may contact your provider through [employer] and have the prescription routed to another pharmacy.  Your safety is important to [place]. If you have drug allergies check your prescription carefully.   For the next 24 hours you can use MyChart to ask questions about today's visit, request a non-urgent call back, or ask for a work or school excuse. You will get an email in the next two days asking about your experience. I hope that your e-visit has been valuable and will speed your recovery.   Meds ordered this encounter  Medications   nitrofurantoin, macrocrystal-monohydrate, (MACROBID) 100 MG capsule    Sig: Take 1 capsule (100 mg total) by mouth 2 (two) times daily for 5 days.    Dispense:  10 capsule    Refill:  0     I spent approximately 5 minutes reviewing the patient's history, current symptoms and coordinating their care today.

## 2022-11-26 ENCOUNTER — Encounter (INDEPENDENT_AMBULATORY_CARE_PROVIDER_SITE_OTHER): Payer: Self-pay

## 2022-11-26 ENCOUNTER — Ambulatory Visit (INDEPENDENT_AMBULATORY_CARE_PROVIDER_SITE_OTHER): Payer: Commercial Managed Care - POS | Admitting: Family Medicine

## 2022-11-26 VITALS — BP 116/84 | HR 82 | Temp 98.6°F

## 2022-11-26 DIAGNOSIS — D225 Melanocytic nevi of trunk: Secondary | ICD-10-CM

## 2022-11-26 NOTE — Progress Notes (Signed)
[facility]  URGENT  CARE  PROGRESS NOTE     Patient: Becky Bryan   Date: 11/26/2022   MRN: [id number]       Becky Bryan is a 27 y.o. female      HISTORY     History obtained from: Patient    Chief Complaint   Patient presents with    Tick Bite     6 months- Pt states mole on back, pt suspects tick         Patient reports 6+ month history of non tender lesion of L lower back.  Is growing of late.  Shown to mother yesterday, mother recommended patient seek evaluation.  Patient is new to the area, hence urgent care evaluation.  No discharge.  No history of similar            Review of Systems   Constitutional:  Negative for fatigue and fever.   Skin:  Positive for rash. Negative for color change, pallor and wound.       History:    Pertinent Past Medical, Surgical, Family and Social History were reviewed.      Current Medications[1]    Allergies[2]    Medications and Allergies reviewed.    PHYSICAL EXAM     Vitals:    11/26/22 0948   BP: 116/84   BP Site: Right arm   Patient Position: Sitting   Pulse: 82   Temp: 98.6 F (37 C)   TempSrc: Tympanic   SpO2: 98%       Physical Exam  Constitutional:       General: She is not in acute distress.     Appearance: Normal appearance. She is well-developed. She is not ill-appearing.   HENT:      Head: Normocephalic and atraumatic.     Eyes: Conjunctivae are normal. Cardiovascular:      Rate and Rhythm: Normal rate and regular rhythm.   Pulmonary:      Effort: Pulmonary effort is normal.      Breath sounds: Normal breath sounds.   Neurological:      Mental Status: She is alert and oriented to person, place, and time.   Skin:     General: Skin is warm.      Findings: Lesion (0.25cm darkened, raised, smooth/rounded lesion of L lumbar area.  non tender.  mild erythema of base of lesion) present.   Psychiatric:         Mood and Affect: Mood normal.         Behavior: Behavior normal.         Thought Content: Thought content normal.         Judgment: Judgment normal.   Vitals and  nursing note reviewed.         UCC COURSE     There were no labs reviewed with this patient during the visit.    There were no x-rays reviewed with this patient during the visit.    Current Inpatient Medications with Last Dose Taken[3]    PROCEDURES     Procedures    MEDICAL DECISION MAKING     History, physical, labs/studies most consistent with atypical nevus as the diagnosis.      ASSESSMENT     Encounter Diagnosis   Name Primary?    Atypical nevus of back Yes                PLAN      PLAN:     Morphologically consistent  with nevus.  No evidence of arthropod components  Given it is raised and reportedly growing, Derm referral made for ? Excision/biopsy  Patient expresses understanding and agrees with plan             Orders Placed This Encounter   Procedures    Ambulatory referral to Dermatology     Requested Prescriptions      No prescriptions requested or ordered in this encounter       Discussed results and diagnosis with patient/family.  Reviewed warning signs for worsening condition, as well as, indications for follow-up with primary care physician and return to urgent care clinic.   Patient/family expressed understanding of instructions.     An After Visit Summary was provided to the patient.           [1] No current outpatient medications on file.  [2] No Known Allergies  [3]   No current facility-administered medications for this visit.

## 2022-11-26 NOTE — Patient Instructions (Signed)
Your lesions is consistent with an atypical nevus (oddly behaving mole).  It may be nothing, but if it's dark and raised and growing, we should consider removing it and testing it    I have also made a referral to Dermatology.  I'll task our team to reach out to you today or the next business day to help secure an appointment      Please call with any questions or concerns

## 2023-01-03 ENCOUNTER — Ambulatory Visit (INDEPENDENT_AMBULATORY_CARE_PROVIDER_SITE_OTHER): Payer: Commercial Managed Care - POS | Admitting: Family Medicine

## 2023-01-03 ENCOUNTER — Encounter (INDEPENDENT_AMBULATORY_CARE_PROVIDER_SITE_OTHER): Payer: Self-pay | Admitting: Family Medicine

## 2023-01-03 VITALS — BP 114/76 | HR 93 | Temp 97.5°F | Ht 61.02 in | Wt 179.0 lb

## 2023-01-03 DIAGNOSIS — Z0000 Encounter for general adult medical examination without abnormal findings: Secondary | ICD-10-CM

## 2023-01-03 DIAGNOSIS — D751 Secondary polycythemia: Secondary | ICD-10-CM

## 2023-01-03 LAB — CBC
Absolute nRBC: 0 10*3/uL (ref <=0.00)
Hematocrit: 44.8 % — ABNORMAL HIGH (ref 34.7–43.7)
Hemoglobin: 15 g/dL — ABNORMAL HIGH (ref 11.4–14.8)
MCH: 30.7 pg (ref 25.1–33.5)
MCHC: 33.5 g/dL (ref 31.5–35.8)
MCV: 91.8 fL (ref 78.0–96.0)
MPV: 11.6 fL (ref 8.9–12.5)
Platelet Count: 319 10*3/uL (ref 142–346)
RBC: 4.88 10*6/uL (ref 3.90–5.10)
RDW: 14 % (ref 11–15)
WBC: 7.66 10*3/uL (ref 3.10–9.50)
nRBC %: 0 /100{WBCs} (ref <=0.0)

## 2023-01-03 LAB — COMPREHENSIVE METABOLIC PANEL
ALT: 20 U/L (ref 0–55)
AST (SGOT): 26 U/L (ref 5–41)
Albumin/Globulin Ratio: 1.3 (ref 0.9–2.2)
Albumin: 4.4 g/dL (ref 3.5–5.0)
Alkaline Phosphatase: 41 U/L (ref 37–117)
Anion Gap: 9 (ref 5.0–15.0)
BUN: 12 mg/dL (ref 7–21)
Bilirubin, Total: 0.5 mg/dL (ref 0.2–1.2)
CO2: 24 meq/L (ref 17–29)
Calcium: 9.7 mg/dL (ref 8.5–10.5)
Chloride: 108 meq/L (ref 99–111)
Creatinine: 0.8 mg/dL (ref 0.4–1.0)
GFR: >60 mL/min/{1.73_m2} (ref >=60.0)
Globulin: 3.3 g/dL (ref 2.0–3.6)
Glucose: 97 mg/dL (ref 70–100)
Hemolysis Index: 5 {index}
Potassium: 4.5 meq/L (ref 3.5–5.3)
Protein, Total: 7.7 g/dL (ref 6.0–8.3)
Sodium: 141 meq/L (ref 135–145)

## 2023-01-03 LAB — LAB USE ONLY - LAVENDER - EDTA HOLD TUBE

## 2023-01-03 LAB — TSH: TSH: 2 u[IU]/mL (ref 0.35–4.94)

## 2023-01-03 LAB — HEMOGLOBIN A1C
Average Estimated Glucose: 88.2 mg/dL
Hemoglobin A1C: 4.7 % (ref 4.6–5.6)

## 2023-01-03 LAB — LAB USE ONLY - GOLD SST HOLD TUBE

## 2023-01-03 LAB — LAB USE ONLY - HIV 1/2 AG/AB 4TH GENERATION WITH REFLEX: HIV Ag/Ab 4th Generation: NONREACTIVE

## 2023-01-03 LAB — LIPID PANEL
Cholesterol / HDL Ratio: 3.2 {index}
Cholesterol: 171 mg/dL (ref <=199)
HDL: 53 mg/dL (ref >=40)
LDL Calculated: 101 mg/dL — ABNORMAL HIGH (ref 0–99)
Triglycerides: 84 mg/dL (ref 34–149)
VLDL Calculated: 17 mg/dL (ref 10–40)

## 2023-01-03 LAB — SYPHILIS SCREEN, IGG AND IGM: Syphilis Screen IgG and IgM: NONREACTIVE

## 2023-01-03 LAB — LAB USE ONLY - HEPATITIS C ANTIBODY, TOTAL: Hepatitis C Antibody: NONREACTIVE

## 2023-01-03 NOTE — Progress Notes (Signed)
[name]                  Subjective:      Date: 01/03/2023 8:29 AM   Patient ID: Becky Bryan is a 27 y.o. female.    Chief Complaint:  Chief Complaint   Patient presents with    Annual Exam     fasting    Establish Care       HPI  Visit Type: Health Maintenance Visit  Work Status: working full-time, full time online study, Secondary school teacher  Reported Health: fair health  Diet:  carb heavy, full plate every meal  Exercise: daily 1hr/day  Dental: dentist visit > 1 year ago  Vision: glasses and eye exam < 1 year ago  Hearing: normal hearing  Immunization Status: immunizations up to date  Reproductive Health: sexually active and History of STD: negative  Prior Screening Tests: prostate cancer screening not appropriate at this time, colon cancer screening not approriate at this time, and lung cancer screening not appropriate at this time  General Health Risks: no family history of prostate cancer, no family history of colon cancer, family history of breast cancer, and history of lung, gastric cancer, and leukemia  Safety Elements Used: uses seat belts and sunscreen use    1. 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      Current Medications:  Outpatient Medications Marked as Taking for the 01/03/23 encounter (Office Visit) with Claudell Kyle, MD   Medication Sig Dispense Refill    clonazePAM (KlonoPIN) 0.5 MG tablet Take 1 tablet (0.5 mg) by mouth as needed      lamoTRIgine (LaMICtal) 25 MG tablet Take 1 tablet (25 mg) by mouth daily      Multiple Vitamin (multivitamin) capsule Take 1 capsule by mouth daily       POTASSIUM PO Take by mouth      rizatriptan (MAXALT) 5 MG tablet Take 1 tablet (5 mg) by mouth as needed for Migraine May repeat in 2 hours if needed         Allergies:  Allergies   Allergen Reactions    Banana Itching and Swelling       Past Medical History:  Past Medical History:   Diagnosis Date    Anxiety     Depression        Past Surgical History:  Past Surgical History:   Procedure Laterality Date    WISDOM TOOTH EXTRACTION Bilateral     5       Family History:  Family History   Problem Relation Age of Onset    Celiac disease Mother     No known problems Father     Anorexia nervosa Sister        Social History:  Social History     Tobacco Use    Smoking status: Some Days     Types: Cigarettes     Passive exposure: Never    Smokeless tobacco: Never  Tobacco comments:     Socially when drinking   Vaping Use    Vaping status: Never Used   Substance Use Topics    Alcohol use: Yes     Alcohol/week: 6.0 standard drinks of alcohol     Types: 6 Shots of liquor per week    Drug use: Yes     Types: Marijuana   Vapes, edibles     The following sections were reviewed this encounter by the provider:        Vitals:  BP 114/76 (BP Site: Left arm, Patient Position: Sitting, Cuff Size: Large)   Pulse 93   Temp 97.5 F (36.4 C) (Temporal)   Ht 1.55 m (5' 1.02")   Wt 81.2 kg (179 lb)   LMP 12/29/2022 (Exact Date)   SpO2 98%   BMI 33.80 kg/m    Wt Readings from Last 3 Encounters:   01/03/23 81.2 kg (179 lb)       ROS:   Review of Systems   General/Constitutional:   Denies Change in appetite. Denies Chills. Denies Fatigue. Denies Fever. Denies Weight gain. Denies Weight loss.   Ophthalmologic:   Denies Blurred vision. Denies Diminished visual acuity. Denies Eye Pain.   ENT:   Denies Hearing Loss. Denies Nasal Discharge. Denies Hoarseness. Denies Ear pain. Denies Nosebleed. Denies Sinus pain. Denies Sore throat. Denies Sneezing.   Endocrine:   Denies Polydipsia. Denies Polyuria.   Cardiovascular:   Denies Chest pain.  Denies Chest pain with exertion. Denies Leg Claudication. Denies Palpitations. Denies Swelling in hands/feet.   Respiratory:   Denies Paroxysmal Nocturnal Dyspnea. Denies Cough. Denies Orthopnea. Denies Shortness of breath. Denies Daytime Hypersomnolence. Denies Snoring. Denies Witness Apnea. Denies Wheezing.   Gastrointestinal:   Denies Abdominal pain. Denies Blood in stool. Denies Constipation. Denies Diarrhea. Denies Heartburn. Denies Nausea. Denies Vomiting.   Hematology:   Denies Easy bruising. Denies Easy Bleeding.   Genitourinary:   Denies Blood in urine. Denies abnormal uterine bleeding. Denies dysuria. Denies incontinence   Musculoskeletal:   Denies Joint pain. Denies Leg cramps. Denies Weakness in LE. Denies Swollen joints.   Peripheral Vascular:   Denies Cold extremities. Denies Decreased sensation in extremities. Denies Painful extremities.   Skin:   Denies Itching. Denies Change in Mole(s). Denies Rash.   Neurologic:   Denies Balance difficulty. Denies Dizziness. Denies Headache. Denies Pre-Syncope. Denies Memory loss.   Psychiatric:   Denies Anxiety. Denies Depressed mood. Denies Difficulty sleeping. Denies Mood Swings.     Objective:   Physical Exam   Examination:   General Examination:   GENERAL APPEARANCE: alert, in no acute distress, well developed, well nourished, oriented to time, place, and person.   HEAD: normal appearance, atraumatic.   EYES: extraocular movement intact (EOMI), pupils equal, round, reactive to light and accommodation, sclera anicteric, conjunctiva clear.   EARS: tympanic membranes normal bilaterally, external canals normal .   NOSE: normal nasal mucosa, no lesions.   ORAL CAVITY: normal oropharynx, normal lips, mucosa moist, no lesions.   THROAT: normal appearance, clear, no erythema.   NECK/THYROID: neck supple, no carotid bruit, carotid pulse 2+ bilaterally, no cervical lymphadenopathy, no neck mass palpated, no jugular venous distention, no thyromegaly.   LYMPH NODES: no  palpable adenopathy.   SKIN: good turgor, no rashes, no suspicious lesions.   HEART: S1, S2 normal, no murmurs, rubs, gallops, regular rate and rhythm.   LUNGS: normal effort / no distress, normal breath sounds, clear to auscultation bilaterally, no wheezes, rales, rhonchi.  ABDOMEN: bowel sounds present, no hepatosplenomegaly, soft, nontender, nondistended.   RECTAL: not examined   FEMALE GENITOURINARY: not examined  MUSCULOSKELETAL: full range of motion, no swelling or deformity.   EXTREMITIES: no edema, no clubbing, cyanosis, or edema.   PERIPHERAL PULSES: 2+ dorsalis pedis, 2+ posterior tibial.   NEUROLOGIC: nonfocal, cranial nerves 2-12 grossly intact, deep tendon reflexes 2+ symmetrical, normal strength, tone and reflexes, sensory exam intact.   PSYCH: cognitive function intact, mood/affect full range, speech clear.        Assessment:       1. Routine adult health maintenance      Plan:     Health Maintenance:       Follow-up:   No follow-ups on file.     Claudell Kyle, MD

## 2023-01-03 NOTE — Progress Notes (Signed)
[facility]                  Subjective:      Date: 01/03/2023 9:42 AM   Patient ID: Becky Bryan is a 27 y.o. female.    Chief Complaint:  Chief Complaint   Patient presents with    Annual Exam     fasting    Establish Care       HPI  Visit Type: Health Maintenance Visit  Work Status: working full-time, Consulting civil engineer  Reported Health: good health  Diet: moderate compliance with recommended diet  Exercise: regularly  Dental: dentist visit > 1 year ago  Vision: regular eye exams   Hearing: normal hearing  Immunization Status: Influenza vaccination due and COVID booster due  Reproductive Health: premenopausal  Prior Screening Tests: colon cancer screening not approriate at this time  General Health Risks: family history of breast cancer  Safety Elements Used: uses seat belts, smoke detectors in household, carbon monoxide detectors in household, and sunscreen use    1. [name] is overall doing well. 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 Also seeing Psychiatry, NP Herma Carson.       Problem List:  Problem List[1]    Current Medications:  Medications Taking[2]    Allergies:  Allergies[3]    Past Medical History:  Medical History[4]    Past Surgical History:  Past Surgical History:   Procedure Laterality Date    WISDOM TOOTH EXTRACTION Bilateral     5       Family History:  Family History   Problem Relation Age of Onset    Celiac disease Mother     No known problems Father     Anorexia nervosa Sister     Diabetes Maternal Grandfather     Stroke Maternal Grandfather     Lung cancer Paternal Grandfather         nicotine use    Breast cancer Other         maternal great aunt    Leukemia Paternal Aunt     Stomach cancer Paternal Uncle     Heart disease Neg Hx        Social History:  Social History[5]     The following sections were reviewed this encounter by the provider:        Vitals:  BP 114/76 (BP Site: Left arm, Patient  Position: Sitting, Cuff Size: Large)   Pulse 93   Temp 97.5 F (36.4 C) (Temporal)   Ht 1.55 m (5' 1.02")   Wt 81.2 kg (179 lb)   LMP 12/29/2022 (Exact Date)   SpO2 98%   BMI 33.80 kg/m    Wt Readings from Last 3 Encounters:   01/03/23 81.2 kg (179 lb)       ROS:   Review of Systems   General/Constitutional:   Denies Change in appetite. Denies Chills. Denies Fatigue. Denies Fever. Denies Weight gain. Denies Weight loss.   Ophthalmologic:   Denies Blurred vision. Denies Diminished visual acuity. Denies Eye Pain.   ENT:   Denies Hearing Loss. Denies Nasal Discharge. Denies Hoarseness. Denies Ear pain. Denies Nosebleed. Denies Sinus pain. Denies Sore throat. Denies Sneezing.   Endocrine:   Denies Polydipsia. Denies Polyuria.   Cardiovascular:   Denies Chest pain. Denies Chest pain with exertion. Denies Leg Claudication. Denies Palpitations. Denies Swelling in hands/feet.   Respiratory:   Denies Paroxysmal Nocturnal Dyspnea. Denies  Cough. Denies Orthopnea. Denies Shortness of breath. Denies Daytime Hypersomnolence. Denies Snoring. Denies Witness Apnea. Denies Wheezing.   Gastrointestinal:   Denies Abdominal pain. Denies Blood in stool. Denies Constipation. Denies Diarrhea. Denies Heartburn. Denies Nausea. Denies Vomiting.   Hematology:   Denies Easy bruising. Denies Easy Bleeding.   Genitourinary:   Denies Blood in urine. Denies abnormal uterine bleeding. Denies dysuria. Denies incontinence   Musculoskeletal:   + Joint pain. Denies Leg cramps. Denies Weakness in LE. Denies Swollen joints.   Peripheral Vascular:   Denies Cold extremities. Denies Decreased sensation in extremities. Denies Painful extremities.   Skin:   Denies Itching. Denies Change in Mole(s). Denies Rash.   Neurologic:   Denies Balance difficulty. Denies Dizziness. Denies Headache. Denies Pre-Syncope. Denies Memory loss.   Psychiatric:   + Anxiety. Denies Depressed mood. Denies Difficulty sleeping. Denies Mood Swings.     Objective:   Physical  Exam   Examination:   General Examination:   GENERAL APPEARANCE: alert, in no acute distress, well developed, well nourished, oriented to time, place, and person.   HEAD: normal appearance, atraumatic.   EYES: extraocular movement intact (EOMI), pupils equal, round, reactive to light and accommodation, sclera anicteric, conjunctiva clear.   EARS: tympanic membranes normal bilaterally, external canals normal .   NOSE: normal nasal mucosa, no lesions.   ORAL CAVITY: normal oropharynx, normal lips, mucosa moist, no lesions.   THROAT: normal appearance, clear, no erythema.   NECK/THYROID: neck supple, no carotid bruit, carotid pulse 2+ bilaterally, no cervical lymphadenopathy, no neck mass palpated, no jugular venous distention, no thyromegaly.   LYMPH NODES: no palpable adenopathy.   SKIN: good turgor, no rashes, no suspicious lesions.   HEART: S1, S2 normal, no murmurs, rubs, gallops, regular rate and rhythm.   LUNGS: normal effort / no distress, normal breath sounds, clear to auscultation bilaterally, no wheezes, rales, rhonchi.   ABDOMEN: bowel sounds present, no hepatosplenomegaly, soft, nontender, nondistended.   RECTAL: not examined   FEMALE GENITOURINARY: not examined  MUSCULOSKELETAL: full range of motion, no swelling or deformity.   EXTREMITIES: no edema, no clubbing, cyanosis, or edema.   PERIPHERAL PULSES: 2+ dorsalis pedis, 2+ posterior tibial.   NEUROLOGIC: nonfocal, cranial nerves 2-12 grossly intact, deep tendon reflexes 2+ symmetrical, normal strength, tone and reflexes, sensory exam intact.   PSYCH: cognitive function intact, mood/affect full range, speech clear.        Assessment:       1. Routine adult health maintenance  - CBC without Differential; Future  - Comprehensive Metabolic Panel; Future  - Hemoglobin A1C; Future  - TSH; Future  - Lipid Panel; Future  - Referral to Orthopedic Surgery ([place]); Future  - Referral to Physical Therapy-[facility]; Future  - HIV-1/2, Antigen and Antibody with  Reflex to Confirmation (Order); Future  - Syphilis Screen, IgG and IgM; Future  - Chlamydia trachomatis, Neisseria gonorrhea and Trichomonas vaginalis, PCR; Future  - Hepatitis C Antibody, Total (Order); Future  - ThinPrep Imaging Pap w/reflex HPV mRNA E6/E7; Future  - ThinPrep Imaging Pap w/reflex HPV mRNA E6/E7    - declines contraception, not needed  - flu and COVID vaccine in 1 mo    Plan:     Health Maintenance:   Recommend optimizing low carbohydrate diet efforts and obtaining at least 150 minutes of aerobic exercise per week. Recommend 20-25 grams of dietary fiber daily. Recommend drinking at least 60-80 ounces of water per day.Vision screening UTD. Dental Screening is due.  Follow-up:   No follow-ups on file.     Claudell Kyle, MD          [1] There is no problem list on file for this patient.   [2]   Outpatient Medications Marked as Taking for the 01/03/23 encounter (Office Visit) with Claudell Kyle, MD   Medication Sig Dispense Refill    clonazePAM (KlonoPIN) 0.5 MG tablet Take 1 tablet (0.5 mg) by mouth as needed      lamoTRIgine (LaMICtal) 25 MG tablet Take 1 tablet (25 mg) by mouth daily      Multiple Vitamin (multivitamin) capsule Take 1 capsule by mouth daily      POTASSIUM PO Take by mouth      rizatriptan (MAXALT) 5 MG tablet Take 1 tablet (5 mg) by mouth as needed for Migraine May repeat in 2 hours if needed     [3]   Allergies  Allergen Reactions    Banana Itching and Swelling   [4]   Past Medical History:  Diagnosis Date    Anxiety     Depression    [5]   Social History  Tobacco Use    Smoking status: Some Days     Types: Cigarettes     Passive exposure: Never    Smokeless tobacco: Never    Tobacco comments:     Socially when drinking   Vaping Use    Vaping status: Never Used   Substance Use Topics    Alcohol use: Yes     Alcohol/week: 6.0 standard drinks of alcohol     Types: 6 Shots of liquor per week    Drug use: Yes     Types: Marijuana

## 2023-01-05 NOTE — Addendum Note (Signed)
Addended by: Claudell Kyle on: 01/05/2023 11:18 AM     Modules accepted: Orders

## 2023-01-07 LAB — CHLAMYDIA TRACHOMATIS, NEISSERIA GONORRHEA AND TRICHOMONAS VAGINALIS, PCR
Chlamydia trachomatis DNA: NEGATIVE
Neisseria gonorrhoeae DNA: NEGATIVE
Trichomonas vaginalis DNA: NEGATIVE

## 2023-01-10 LAB — THINPREP IMAGING PAP W/REFLEX HPV MRNA E6/E7

## 2023-01-31 ENCOUNTER — Encounter (INDEPENDENT_AMBULATORY_CARE_PROVIDER_SITE_OTHER): Payer: Self-pay

## 2023-01-31 ENCOUNTER — Ambulatory Visit (INDEPENDENT_AMBULATORY_CARE_PROVIDER_SITE_OTHER): Payer: Commercial Managed Care - POS | Admitting: Family

## 2023-01-31 VITALS — BP 127/90 | HR 101 | Temp 98.4°F | Resp 16 | Wt 178.0 lb

## 2023-01-31 DIAGNOSIS — R112 Nausea with vomiting, unspecified: Secondary | ICD-10-CM

## 2023-01-31 DIAGNOSIS — R1912 Hyperactive bowel sounds: Secondary | ICD-10-CM

## 2023-01-31 DIAGNOSIS — A059 Bacterial foodborne intoxication, unspecified: Secondary | ICD-10-CM

## 2023-01-31 LAB — POC COVID QUICKVUE ANTIGEN: QuickVue SARS COV2 Antigen POCT: NEGATIVE

## 2023-01-31 LAB — POCT INFLUENZA A/B
POCT Rapid Influenza A AG: NEGATIVE
POCT Rapid Influenza B AG: NEGATIVE

## 2023-01-31 MED ORDER — AZITHROMYCIN 250 MG PO TABS
ORAL_TABLET | ORAL | 0 refills | Status: AC
Start: 2023-01-31 — End: 2023-02-05

## 2023-01-31 MED ORDER — ONDANSETRON 4 MG PO TBDP
4.0000 mg | ORAL_TABLET | Freq: Four times a day (QID) | ORAL | 0 refills | Status: AC | PRN
Start: 2023-01-31 — End: 2023-02-07

## 2023-01-31 NOTE — Progress Notes (Signed)
[facility]  URGENT  CARE  PROGRESS NOTE     Patient: Becky Bryan   Date: 01/31/2023   MRN: [id number]       Becky Bryan is a 27 y.o. female      HISTORY     History obtained from: Patient    Chief Complaint   Patient presents with    Nausea     Onset - Saturday, vomiting, nausea, can't hold food down, slight dizziness, similar issues in the past and diagnosed with flu        HPI   Abdominal pain  The pain began  7 days ago.   The pain is located  in the Diffuse.   The pain  is constant, is aching.   The severity of the pain  and is mild/moderate.   Aggravating factors include  solid food.   Alleviating factors include  none reoported.   Associated factors include  bloating and gas, diarrhea.   Prior testing include  none.   Overall condition  is variable.   Nausea/Vomiting  The nausea/vomiting  nausea only, began days ago.   Severity of the nausea/vomiting  is moderate.   The character of the nausea/vomiting is  cyclical, voluminous.   Aggravating factors include  food.   Alleviating factors include  sitting upright.   Associated factors include  abdominal cramping, bloating and gas, diarrhea, fatigue.   Medication(s) for nausea/vomiting include  none.   Overall condition  is worsening.   Diarrhea (infectious)  The patient presents for follow-up of infectious diarrhea  after days of diarrhea.   The patient complains of diarrhea  reported as fullness, associated with diarrhea, associated with nausea.   The patient also reports abdominal pain  associated with diarrhea, that is intermittent, is mild in severity.   The patient also reports weight loss  patient denies any observed weight loss.   Other symptoms include  abdominal cramping, bloating and gas.   The patient denies  racing heart, hematemasis, lightheadedness/dizziness, melena, rectal bleeding.     Review of Systems   HENT: Negative.     Eyes: Negative.    Respiratory: Negative.     Cardiovascular: Negative.    Gastrointestinal:  Positive for abdominal  distention, diarrhea, nausea and vomiting.   Genitourinary: Negative.    Musculoskeletal: Negative.    Skin: Negative.    Neurological: Negative.        History:    Pertinent Past Medical, Surgical, Family and Social History were reviewed.      Current Medications[1]    Allergies[2]    Medications and Allergies reviewed.    PHYSICAL EXAM     Vitals:    01/31/23 1103   BP: 127/90   Pulse: (!) 101   Resp: 16   Temp: 98.4 F (36.9 C)   SpO2: 98%   Weight: 80.7 kg (178 lb)       Physical Exam  Constitutional:       General: She is in acute distress.      Appearance: Normal appearance.   HENT:      Head: Normocephalic and atraumatic.     Eyes: Pupils are equal, round, and reactive to light. Cardiovascular:      Rate and Rhythm: Tachycardia present.      Pulses: Normal pulses.      Heart sounds: Normal heart sounds.   Pulmonary:      Effort: Pulmonary effort is normal.      Breath sounds: Normal breath sounds.  Abdominal:      General: Bowel sounds are increased. There is distension.      Tenderness: There is generalized abdominal tenderness. Negative signs include Murphy's sign, Rovsing's sign, McBurney's sign, psoas sign and obturator sign.   Neurological:      Mental Status: She is alert.         UCC COURSE     LABS  The following POCT tests were ordered, reviewed and discussed with the patient/family.     Results       Procedure Component Value Units Date/Time    QuickVue SARS-COV-2 Antigen POCT [[id number]]  (Normal) Collected: 01/31/23 1126    Specimen: Nasal Swab COVID-19 from Nares Updated: 01/31/23 1126     QuickVue SARS COV2 Antigen POCT Negative    POCT Influenza A/B [[id number]]  (Normal) Collected: 01/31/23 1126     Updated: 01/31/23 1126     POCT QC Pass     POCT Rapid Influenza A AG Negative     POCT Rapid Influenza B AG Negative            There were no x-rays reviewed with this patient during the visit.    Current Inpatient Medications with Last Dose Taken[3]    PROCEDURES     Procedures    MEDICAL  DECISION MAKING     History, physical, labs/studies most consistent with Food poisoning as the diagnosis.    Differential Diagnosis: Diarrhea; Waterborne Illness; Acute Cholecystitis; Acute Hepatitis; Diverticulitis, Inflammatory Bowel Disease (Crohn's Disease, Ulcerative Colitis); Mesenteric Ischemia        ASSESSMENT     Encounter Diagnoses   Name Primary?    Food poisoning Yes    Nausea and vomiting, unspecified vomiting type     Hyperactive bowel sounds                 PLAN      PLAN:   --Follow plan of care as prescribed  --Patient expresses understanding and agrees with plan  --Discussed results and diagnosis with patient/family. Reviewed warning signs for worsening condition, as well as, indications for follow-up with primary care physician and return to urgent care clinic. Patient/family expressed understanding of instructions              Orders Placed This Encounter   Procedures    QuickVue SARS-COV-2 Antigen POCT    POCT Influenza A/B     Requested Prescriptions     Signed Prescriptions Disp Refills    azithromycin (ZITHROMAX) 250 MG tablet 6 tablet 0     Sig: Take 2 tabs daily on day 1, then 1 tablet daily for 4 days.    ondansetron (ZOFRAN-ODT) 4 MG disintegrating tablet 15 tablet 0     Sig: Take 1 tablet (4 mg) by mouth every 6 (six) hours as needed for Nausea       Discussed results and diagnosis with patient/family.  Reviewed warning signs for worsening condition, as well as, indications for follow-up with primary care physician and return to urgent care clinic.   Patient/family expressed understanding of instructions.     An After Visit Summary was provided to the patient.           [1]   Current Outpatient Medications:     amphetamine-dextroamphetamine (ADDERALL XR) 10 MG 24 hr capsule, , Disp: , Rfl:     clonazePAM (KlonoPIN) 0.5 MG tablet, Take 1 tablet (0.5 mg) by mouth as needed, Disp: , Rfl:     gabapentin (NEURONTIN) 100 MG  capsule, Take 1 capsule (100 mg) by mouth 3 (three) times daily,  Disp: , Rfl:     lamoTRIgine (LaMICtal) 25 MG tablet, Take 1 tablet (25 mg) by mouth daily, Disp: , Rfl:     Multiple Vitamin (multivitamin) capsule, Take 1 capsule by mouth daily, Disp: , Rfl:     POTASSIUM PO, Take by mouth, Disp: , Rfl:     propranolol (INDERAL) 20 MG tablet, Take 1 tablet (20 mg) by mouth 2 (two) times daily, Disp: , Rfl:     rizatriptan (MAXALT) 5 MG tablet, Take 1 tablet (5 mg) by mouth as needed for Migraine May repeat in 2 hours if needed, Disp: , Rfl:     azithromycin (ZITHROMAX) 250 MG tablet, Take 2 tabs daily on day 1, then 1 tablet daily for 4 days., Disp: 6 tablet, Rfl: 0    ondansetron (ZOFRAN-ODT) 4 MG disintegrating tablet, Take 1 tablet (4 mg) by mouth every 6 (six) hours as needed for Nausea, Disp: 15 tablet, Rfl: 0  [2]   Allergies  Allergen Reactions    Banana Itching and Swelling   [3]   No current facility-administered medications for this visit.

## 2023-01-31 NOTE — Patient Instructions (Signed)
BRAT DIET    Recommended diets  For the FIRST 8-12 HOURS, Clear liquids in small amounts (sips), frequently: for 3 days  --Gatorade  --Chicken broth  --Apple or pear juice  --7-Up! Sprite (flat)  --Gelatin  --Herbal tea  --Bouillon    For the NEXT 12-24 HOURS, if improved, you may add to the above:  --Saltine crackers  --Google with jelly  --Bananas  --Plain white cooked rice  --Cereal (bland, i.e. Cheerios)  --Apple sauce  --Canned peaches or pears  --Clear soups (not cream soups) with very soft noodles    NEXT 24 HOURS, if improvement continues and your stools are formed and firm, you may add:  Boiled chicken  Lean beef (baked, broiled, or boiled)  Egg (poached or scrambled)  Cooked carrots, green peas, green beans, or baked potato (no skin)  Add Ferment foods like Yogurt / sauerkraut / kimchi / kefir / Cottage cheese / Kombucha Tea if no food allergies present   NO broccoli, cabbage, Brussels sprouts, or beans for 2 weeks    DO NOT EAT these foods until you have had either normal Bowel Movement's or no Bowel Movement for 24 hours:   THINGS TO AVOID WHILE RECOVERING:  Raw, fried, or spicy foods  Citrus fruits or juices  Bran/whole grains  Alcohol  Caffeinated beverages  Candy  Dairy  Aspirin  Ibuprofen.

## 2023-05-27 ENCOUNTER — Telehealth (INDEPENDENT_AMBULATORY_CARE_PROVIDER_SITE_OTHER): Payer: Self-pay | Admitting: Family Medicine

## 2023-05-27 NOTE — Telephone Encounter (Signed)
 Pt applying for jobs and needs to have a Schedule "A" letter concerning her disability ehlers danlos syndrome. Please advise

## 2023-05-30 ENCOUNTER — Encounter (INDEPENDENT_AMBULATORY_CARE_PROVIDER_SITE_OTHER): Payer: Self-pay | Admitting: Family Medicine

## 2023-05-30 ENCOUNTER — Ambulatory Visit (INDEPENDENT_AMBULATORY_CARE_PROVIDER_SITE_OTHER): Payer: Commercial Managed Care - POS | Admitting: Family Medicine

## 2023-05-30 VITALS — BP 105/68 | HR 69 | Temp 98.5°F | Ht 60.75 in | Wt 173.4 lb

## 2023-05-30 DIAGNOSIS — Q796 Ehlers-Danlos syndrome, unspecified: Secondary | ICD-10-CM

## 2023-05-30 DIAGNOSIS — Z0289 Encounter for other administrative examinations: Secondary | ICD-10-CM

## 2023-05-30 DIAGNOSIS — F319 Bipolar disorder, unspecified: Secondary | ICD-10-CM

## 2023-05-30 NOTE — Progress Notes (Signed)
 Have you seen any specialists/other providers since your last visit with us ?    No    Arm preference verified?   Yes, no preference    Health Maintenance Due   Topic Date Due    Tetanus Ten-Year  Never done    HPV Series (1 - 3-dose series) Never done    HIGH RISK PNEUMONIA  Never done    INFLUENZA VACCINE  12/05/2022    COVID-19 Vaccine (1 - 2024-25 season) Never done

## 2023-05-30 NOTE — Progress Notes (Signed)
 [facility]                       Date of Exam: 05/30/2023 9:38 AM        Patient ID: Becky Bryan is a 28 y.o. female.  Attending Physician: Buren Miyamoto, MD        Chief Complaint:    Chief Complaint   Patient presents with    OTHER     Schedule A form              HPI:    HPI    1. Becky Bryan has Ehrlers Danlos, mainly BLE pain/ laxity, formally diag Jan 2023. Walks with a cane.   2. Has ADHD, anxiety, and depression, Bipolar (since Aug 2024) with psychiatrist and [facility]. Tapering off Lamictal, plan to start another agent for bipolar. Mood stable.             Problem List:    Problem List[1]      Allergies:    Allergies[2]          Current Meds:    Medications Taking[3]        Vital Signs:    BP 105/68 (BP Site: Left arm, Patient Position: Sitting, Cuff Size: Small)   Pulse 69   Temp 98.5 F (36.9 C) (Oral)   Ht 1.543 m (5' 0.75)   Wt 78.7 kg (173 lb 6.4 oz)   LMP 05/25/2023 (Exact Date)   SpO2 99%   BMI 33.03 kg/m          Physical Exam:    GEN: Well-appearing, no acute distress        Assessment & Plan:    1. Ehlers-Danlos syndrome    2. Bipolar 1 disorder    3. Encounter for completion of form with patient    - here for form stating schedule A disability  - has a few qualifying diagnosis, which we reviewed  - everything appears stable  - form provided to patient            Follow-up:    [facility] PRN         Buren Miyamoto, MD          [1]   Patient Active Problem List  Diagnosis    Generalized hypermobility of joints    MDD (major depressive disorder), severe    Generalized anxiety disorder   [2]   Allergies  Allergen Reactions    Banana Itching and Swelling   [3]   Outpatient Medications Marked as Taking for the 05/30/23 encounter (Office Visit) with Adain Geurin, MD   Medication Sig Dispense Refill    amphetamine-dextroamphetamine (ADDERALL XR) 10 MG 24 hr capsule       clonazePAM (KlonoPIN) 0.5 MG tablet Take 1 tablet (0.5 mg) by mouth as needed      gabapentin  (NEURONTIN) 100 MG capsule Take 1 capsule (100 mg) by mouth 3 (three) times daily      Multiple Vitamin (multivitamin) capsule Take 1 capsule by mouth daily      propranolol (INDERAL) 20 MG tablet Take 1 tablet (20 mg) by mouth 2 (two) times daily      rizatriptan (MAXALT) 5 MG tablet Take 1 tablet (5 mg) by mouth as needed for Migraine May repeat in 2 hours if needed

## 2023-08-15 ENCOUNTER — Ambulatory Visit (INDEPENDENT_AMBULATORY_CARE_PROVIDER_SITE_OTHER): Payer: Self-pay

## 2023-08-15 ENCOUNTER — Encounter (INDEPENDENT_AMBULATORY_CARE_PROVIDER_SITE_OTHER): Payer: Self-pay

## 2023-08-15 VITALS — BP 121/84 | HR 92 | Temp 99.0°F | Resp 18 | Ht 61.0 in | Wt 173.0 lb

## 2023-08-15 DIAGNOSIS — R112 Nausea with vomiting, unspecified: Secondary | ICD-10-CM

## 2023-08-15 DIAGNOSIS — R519 Headache, unspecified: Secondary | ICD-10-CM

## 2023-08-15 MED ORDER — ONDANSETRON 4 MG PO TBDP
4.0000 mg | ORAL_TABLET | Freq: Four times a day (QID) | ORAL | 0 refills | Status: AC | PRN
Start: 2023-08-15 — End: 2023-08-22

## 2023-08-15 NOTE — Progress Notes (Signed)
 [facility]  OFFICE NOTE         Subjective     Patient      Chief Complaint   Patient presents with    Nausea     Onset - last few days, vomiting, nausea, diarrhea, fever        HPI  Becky Bryan is a 28 y.o. female who presents for nausea diarrhea subjective fevers.  She had diarrhea for the first 3 days which resolved yesterday.  Vomiting started yesterday, once yesterday, 4 times today.  Mild frontal headache she believes is associated with dehydration.  Patient vomited today after taking ibuprofen on an empty stomach.  Was initially eating but not solid foods and normal quantities since yesterday.  She is concerned for dehydration since she vomited today after plain water.  No sharp pain, no dizziness, coughing or shortness of breath.  Continues with mild headache which did improve with the ibuprofen even though she vomited short while after taking.  Able to walk easily in clinic, no musculoskeletal pain.  Fevers are subjective.      History:  Medications and Allergies reviewed.   Pertinent Past Medical, Surgical, Family and Social History were reviewed.            Objective     Vitals:    08/15/23 1254   BP: 121/84   Pulse: 92   Resp: 18   Temp: 99 F (37.2 C)   SpO2: 99%   Weight: 78.5 kg (173 lb)   Height: 1.549 m (5\' 1" )       Physical Exam  Vitals reviewed.   Constitutional:       Comments: Patient is alert and oriented in no acute distress.  Sitting upright on exam table, does not appear dehydrated, mucous membranes are moist.  No obvious signs of distress.   HENT:      Head: Normocephalic and atraumatic.   Cardiovascular:      Rate and Rhythm: Normal rate and regular rhythm.   Pulmonary:      Effort: Pulmonary effort is normal.      Breath sounds: Normal breath sounds.   Abdominal:      General: Abdomen is flat. Bowel sounds are normal. There is no distension. There are no signs of injury.      Palpations: Abdomen is soft. There is no hepatomegaly, splenomegaly or mass.      Tenderness:  There is abdominal tenderness in the epigastric area. There is no right CVA tenderness, left CVA tenderness, guarding or rebound. Negative signs include Murphy's sign, Rovsing's sign, McBurney's sign, psoas sign and obturator sign.   Musculoskeletal:         General: Normal range of motion.      Cervical back: Normal range of motion and neck supple.   Skin:     General: Skin is warm and dry.   Neurological:      General: No focal deficit present.      Mental Status: She is oriented to person, place, and time.   Psychiatric:         Mood and Affect: Mood normal.         Behavior: Behavior normal.        Urgent Care Course     There were no labs reviewed with this patient during the visit.    There were no x-rays reviewed with this patient during the visit.             Procedures  Procedures          Assessment / Plan       Differential Diagnoses including but not limited to: Gastroenteritis, strep pharyngitis, viral syndrome    Becky Bryan was seen today for nausea.    Diagnoses and all orders for this visit:    Nausea and vomiting, unspecified vomiting type    Acute nonintractable headache, unspecified headache type    Other orders  -     ondansetron  (ZOFRAN -ODT) 4 MG disintegrating tablet; Take 1 tablet (4 mg) by mouth every 6 (six) hours as needed for Nausea      Patient came to clinic for 4 days of nausea vomiting diarrhea mild headache and mild sore throat.  Diarrhea resolved 2 days ago nausea started yesterday with vomiting.  Possible association between taking Advil on an empty stomach and vomiting.  Patient has decreased oral intake feels she vomits when she takes sips of water.  Possible mild headache due to dehydration.  Patient was able to drink 8 ounces of water well in clinic with no vomiting and stated she felt better and headache improved.  Patient discharged on ondansetron  4 mg to take as needed.  Patient provided a work note to return to work on Monday.  Patient encouraged to hydrate, advance diet as  tolerated, avoid ibuprofen while she has stomach problems and use Tylenol for headache.  Patient said she understood and agreed with plan of care.    The indications for early follow-up with PCP and return to UC were discussed. Patient/family received education on the working diagnosis, diagnostic uncertainties and proposed treatment plan. The possibility of illness progression was discussed and strict ED precautions for serious pathologies were given to patient/family. Written and verbal discharge instructions were provided and discussed, with no apparent barriers. All questions from the patient and family were answered to their satisfaction, and they confirmed understanding of the instructions with no apparent learning barriers.

## 2024-03-12 ENCOUNTER — Encounter (INDEPENDENT_AMBULATORY_CARE_PROVIDER_SITE_OTHER): Payer: Self-pay

## 2024-03-12 ENCOUNTER — Encounter (INDEPENDENT_AMBULATORY_CARE_PROVIDER_SITE_OTHER): Payer: Self-pay | Admitting: Family Medicine

## 2024-03-15 ENCOUNTER — Encounter (INDEPENDENT_AMBULATORY_CARE_PROVIDER_SITE_OTHER): Payer: Self-pay | Admitting: Family Medicine

## 2024-03-15 ENCOUNTER — Ambulatory Visit (INDEPENDENT_AMBULATORY_CARE_PROVIDER_SITE_OTHER): Admitting: Family Medicine

## 2024-03-15 VITALS — BP 110/75 | HR 86 | Temp 98.5°F | Ht 61.3 in | Wt 161.0 lb

## 2024-03-15 DIAGNOSIS — Z0000 Encounter for general adult medical examination without abnormal findings: Secondary | ICD-10-CM

## 2024-03-15 DIAGNOSIS — Z23 Encounter for immunization: Secondary | ICD-10-CM

## 2024-03-15 DIAGNOSIS — Z113 Encounter for screening for infections with a predominantly sexual mode of transmission: Secondary | ICD-10-CM

## 2024-03-15 DIAGNOSIS — Q796 Ehlers-Danlos syndrome, unspecified: Secondary | ICD-10-CM

## 2024-03-15 LAB — LAB USE ONLY - CBC WITH DIFFERENTIAL
Absolute Basophils: 0.05 x10 3/uL (ref 0.00–0.08)
Absolute Eosinophils: 0.29 x10 3/uL (ref 0.00–0.44)
Absolute Immature Granulocytes: 0 x10 3/uL (ref 0.00–0.07)
Absolute Lymphocytes: 1.91 x10 3/uL (ref 0.42–3.22)
Absolute Monocytes: 0.39 x10 3/uL (ref 0.21–0.85)
Absolute Neutrophils: 4.23 x10 3/uL (ref 1.10–6.33)
Absolute nRBC: 0 x10 3/uL (ref <=0.00)
Basophils %: 0.7 %
Eosinophils %: 4.2 %
Hematocrit: 42.4 % (ref 34.7–43.7)
Hemoglobin: 14.5 g/dL (ref 11.4–14.8)
Immature Granulocytes %: 0 %
Lymphocytes %: 27.8 %
MCH: 31.2 pg (ref 25.1–33.5)
MCHC: 34.2 g/dL (ref 31.5–35.8)
MCV: 91.2 fL (ref 78.0–96.0)
MPV: 11.5 fL (ref 8.9–12.5)
Monocytes %: 5.7 %
Neutrophils %: 61.6 %
Platelet Count: 330 x10 3/uL (ref 142–346)
Preliminary Absolute Neutrophil Count: 4.23 x10 3/uL (ref 1.10–6.33)
RBC: 4.65 x10 6/uL (ref 3.90–5.10)
RDW: 12 % (ref 11–15)
WBC: 6.87 x10 3/uL (ref 3.10–9.50)
nRBC %: 0 /100{WBCs} (ref <=0.0)

## 2024-03-15 LAB — HEMOGLOBIN A1C
Average Estimated Glucose: 88.2 mg/dL
Hemoglobin A1C: 4.7 % (ref 4.6–5.6)

## 2024-03-15 LAB — COMPREHENSIVE METABOLIC PANEL
ALT: 13 U/L (ref <=55)
AST (SGOT): 18 U/L (ref <=41)
Albumin/Globulin Ratio: 1.8 (ref 0.9–2.2)
Albumin: 4.5 g/dL (ref 3.5–4.9)
Alkaline Phosphatase: 37 U/L (ref 37–117)
Anion Gap: 7 (ref 5.0–15.0)
BUN: 13 mg/dL (ref 7–21)
Bilirubin, Total: 0.8 mg/dL (ref 0.2–1.2)
CO2: 24 meq/L (ref 17–29)
Calcium: 9.5 mg/dL (ref 8.5–10.5)
Chloride: 109 meq/L (ref 99–111)
Creatinine: 0.7 mg/dL (ref 0.4–1.0)
GFR: >60 mL/min/1.73 m2 (ref >=60.0)
Globulin: 2.5 g/dL (ref 2.0–3.6)
Glucose: 90 mg/dL (ref 70–100)
Hemolysis Index: 7 {index}
Potassium: 3.9 meq/L (ref 3.5–5.3)
Protein, Total: 7 g/dL (ref 6.0–8.3)
Sodium: 140 meq/L (ref 135–145)

## 2024-03-15 LAB — THYROID STIMULATING HORMONE (TSH) WITH REFLEX TO FREE T4: TSH: 0.74 u[IU]/mL (ref 0.35–4.94)

## 2024-03-15 LAB — SYPHILIS SCREEN, IGG AND IGM: Syphilis Screen IgG and IgM: NONREACTIVE

## 2024-03-15 LAB — LAB USE ONLY - GOLD SST HOLD TUBE

## 2024-03-15 LAB — LAB USE ONLY - LAVENDER - EDTA HOLD TUBE

## 2024-03-15 LAB — HIV-1/2, ANTIGEN AND ANTIBODY WITH REFLEX TO CONFIRMATION: HIV Ag/Ab 4th Generation: NONREACTIVE

## 2024-03-15 NOTE — Progress Notes (Signed)
 [facility]                  Subjective:      Date: 03/15/2024 10:58 AM   Patient ID: Becky Bryan is a 28 y.o. female.    Chief Complaint:  Chief Complaint   Patient presents with    Annual Exam     Not fasting       HPI  Visit Type: Health Maintenance Visit  Work Status: working full-time, consulting civil engineer  Reported Health: good health  Diet: moderate compliance with recommended diet  Exercise: regularly  Dental: dentist visit > 1 year ago  Vision: regular eye exams   Hearing: normal hearing  Immunization Status: Influenza vaccination due and COVID booster due  Reproductive Health: premenopausal  Prior Screening Tests: colon cancer screening not approriate at this time  General Health Risks: family history of breast cancer  Safety Elements Used: uses seat belts, smoke detectors in household, carbon monoxide detectors in household, and sunscreen use    1. Becky Bryan recently reactivated her insurance.   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   2. Has ADHD, anxiety, and depression, Bipolar (since Aug 2024). Off meds since Feb 2025, saw psychiatrist last week, currently depressed, but stable. Plan to resume Lamictal. Next appt 1st week of decemeber    3. Walking pad at home, walking 30-45 mins/ day          Problem List:  Problem List[1]    Current Medications:  Medications Taking[2]    Allergies:  Allergies[3]    Past Medical History:  Medical History[4]    Past Surgical History:  Past Surgical History:   Procedure Laterality Date    WISDOM TOOTH EXTRACTION Bilateral     5       Family History:  Family History   Problem Relation Name Age of Onset    Celiac disease Mother      No known problems Father      Anorexia nervosa Sister      Heart disease Sister          POTs    Diabetes Maternal Grandfather      Stroke Maternal Grandfather      Lung cancer Paternal Grandfather          nicotine use    Leukemia Paternal Aunt       Stomach cancer Paternal Uncle      Breast cancer Other          maternal great aunt       Social History:  Social History[5]     The following sections were reviewed this encounter by the provider:        Vitals:  BP 110/75 (BP Site: Right arm, Patient Position: Sitting, Cuff Size: Medium)   Pulse 86   Temp 98.5 F (36.9 C) (Oral)   Ht 1.557 m (5' 1.3)   Wt 73 kg (161 lb)   LMP 02/24/2024 (Exact Date)   SpO2 98%   BMI 30.12 kg/m    Wt Readings from Last 3 Encounters:   03/15/24 73 kg (161 lb)   08/15/23 78.5 kg (173 lb)   05/30/23 78.7 kg (173 lb 6.4 oz)       ROS:   Review of Systems   General/Constitutional:   Denies Change in appetite. Denies Chills. Denies Fatigue. Denies Fever. Denies Weight gain. + Weight loss.   Ophthalmologic:   Denies  Blurred vision. Denies Diminished visual acuity. Denies Eye Pain.   ENT:   Denies Hearing Loss. Denies Nasal Discharge. Denies Hoarseness. Denies Ear pain. Denies Nosebleed. Denies Sinus pain. Denies Sore throat. Denies Sneezing.   Endocrine:   Denies Polydipsia. Denies Polyuria.   Cardiovascular:   Denies Chest pain. Denies Chest pain with exertion. Denies Leg Claudication. Denies Palpitations. Denies Swelling in hands/feet.   Respiratory:   Denies Paroxysmal Nocturnal Dyspnea. Denies Cough. Denies Orthopnea. Denies Shortness of breath. Denies Daytime Hypersomnolence. Denies Snoring. Denies Witness Apnea. Denies Wheezing.   Gastrointestinal:   Denies Abdominal pain. Denies Blood in stool. Denies Constipation. Denies Diarrhea. Denies Heartburn. Denies Nausea. Denies Vomiting.   Hematology:   Denies Easy bruising. Denies Easy Bleeding.   Genitourinary:   Denies Blood in urine. Denies abnormal uterine bleeding. Denies dysuria. Denies incontinence   Musculoskeletal:   + Joint pain. Denies Leg cramps. Denies Weakness in LE. Denies Swollen joints.   Peripheral Vascular:   Denies Cold extremities. Denies Decreased sensation in extremities. Denies Painful extremities.    Skin:   Denies Itching. Denies Change in Mole(s). Denies Rash.   Neurologic:   Denies Balance difficulty. Denies Dizziness. Denies Headache. Denies Pre-Syncope. Denies Memory loss.   Psychiatric:   + Anxiety. + Depressed mood. Denies Difficulty sleeping. Denies Mood Swings.     Objective:   Physical Exam   Examination:   General Examination:   GENERAL APPEARANCE: alert, in no acute distress, well developed, well nourished, oriented to time, place, and person.   HEAD: normal appearance, atraumatic.   EYES: extraocular movement intact (EOMI), pupils equal, round, reactive to light and accommodation, sclera anicteric, conjunctiva clear.   EARS: tympanic membranes normal bilaterally, external canals normal .   NOSE: normal nasal mucosa, no lesions.   ORAL CAVITY: normal oropharynx, normal lips, mucosa moist, no lesions.   THROAT: normal appearance, clear, no erythema.   NECK/THYROID: neck supple, no carotid bruit, carotid pulse 2+ bilaterally, no cervical lymphadenopathy, no neck mass palpated, no jugular venous distention, no thyromegaly.   LYMPH NODES: no palpable adenopathy.   SKIN: good turgor, no rashes, no suspicious lesions.   HEART: S1, S2 normal, no murmurs, rubs, gallops, regular rate and rhythm.   LUNGS: normal effort / no distress, normal breath sounds, clear to auscultation bilaterally, no wheezes, rales, rhonchi.   ABDOMEN: bowel sounds present, no hepatosplenomegaly, soft, nontender, nondistended.   RECTAL: not examined   FEMALE GENITOURINARY: not examined  MUSCULOSKELETAL: full range of motion, no swelling or deformity.   EXTREMITIES: no edema, no clubbing, cyanosis, or edema.   PERIPHERAL PULSES: 2+ dorsalis pedis, 2+ posterior tibial.   NEUROLOGIC: nonfocal, cranial nerves 2-12 grossly intact, deep tendon reflexes 2+ symmetrical, normal strength, tone and reflexes, sensory exam intact.   PSYCH: cognitive function intact, mood/affect full range, speech clear.        Assessment:       1. Routine  general medical examination at a health care facility  - CBC with Differential (Order); Future  - Comprehensive Metabolic Panel; Future  - Lipid Panel; Future  - Hemoglobin A1C; Future  - Thyroid Stimulating Hormone (TSH) with Reflex to Free T4; Future  - HIV-1/2, Antigen and Antibody with Reflex to Confirmation; Future  - Lavender - EDTA Hold Tube; Future  - Gold SST Hold Tube; Future  - Syphilis Screen, IgG and IgM; Future  - Chlamydia trachomatis, Neisseria gonorrhea and Trichomonas vaginalis, PCR; Future    2. Ehlers-Danlos disease  - Referral to [facility]  Orthopedic Surgery/Sports Medicine ([place]); Future  - Echocardiogram Adult Complete W Clr/ Dopp Waveform; Future    3. Routine screening for STI (sexually transmitted infection)  - HIV-1/2, Antigen and Antibody with Reflex to Confirmation; Future  - Gold SST Hold Tube; Future  - Syphilis Screen, IgG and IgM; Future  - Chlamydia trachomatis, Neisseria gonorrhea and Trichomonas vaginalis, PCR; Future    4. Need for vaccination  - Flu vaccine, TRIVALENT, 6 months and older (FLUARIX/FLULAVAL/FLUZONE), single-dose PF, 0.5 mL    - PAP smear 01/03/23 WNLs, due in 3 years (2027)    Plan:     Health Maintenance:   Recommend optimizing low carbohydrate diet efforts and obtaining at least 150 minutes of aerobic exercise per week. Recommend 20-25 grams of dietary fiber daily. Recommend drinking at least 60-80 ounces of water per day.Vision screening UTD. Dental Screening is due.    Follow-up:   No follow-ups on file.     Buren Miyamoto, MD            [1]   Patient Active Problem List  Diagnosis    Generalized hypermobility of joints    MDD (major depressive disorder), severe (CMS/HCC)    Generalized anxiety disorder   [2]   No outpatient medications have been marked as taking for the 03/15/24 encounter (Office Visit) with Jakiera Ehler, MD.   [3]   Allergies  Allergen Reactions    Banana Itching and Swelling   [4]   Past Medical History:  Diagnosis Date    Anxiety     Depression     [5]   Social History  Tobacco Use    Smoking status: Former     Current packs/day: 0.00     Types: Cigarettes     Quit date: 02/2023     Years since quitting: 1.1     Passive exposure: Never    Smokeless tobacco: Never    Tobacco comments:     Socially when drinking   Vaping Use    Vaping status: Never Used   Substance Use Topics    Alcohol use: Yes     Alcohol/week: 2.0 - 4.0 standard drinks of alcohol     Types: 2 - 4 Standard drinks or equivalent per week     Comment: weekly    Drug use: Yes     Types: Marijuana     Comment: quit June 2025

## 2024-03-16 ENCOUNTER — Ambulatory Visit (INDEPENDENT_AMBULATORY_CARE_PROVIDER_SITE_OTHER): Payer: Self-pay | Admitting: Family Medicine

## 2024-03-16 ENCOUNTER — Other Ambulatory Visit (INDEPENDENT_AMBULATORY_CARE_PROVIDER_SITE_OTHER)

## 2024-03-16 LAB — CHLAMYDIA TRACHOMATIS, NEISSERIA GONORRHEA AND TRICHOMONAS VAGINALIS, PCR
Chlamydia trachomatis DNA: NEGATIVE
Neisseria gonorrhoeae DNA: NEGATIVE
Trichomonas vaginalis DNA: NEGATIVE

## 2024-03-16 LAB — LIPID PANEL
Cholesterol / HDL Ratio: 3 {index}
Cholesterol: 161 mg/dL (ref <=199)
HDL: 54 mg/dL (ref >=40)
LDL Calculated: 94 mg/dL (ref 0–99)
Triglycerides: 66 mg/dL (ref 34–149)
VLDL Calculated: 11 mg/dL (ref 10–40)

## 2024-03-26 ENCOUNTER — Ambulatory Visit (INDEPENDENT_AMBULATORY_CARE_PROVIDER_SITE_OTHER)

## 2024-03-26 VITALS — Ht 61.0 in | Wt 164.0 lb

## 2024-03-26 DIAGNOSIS — M25551 Pain in right hip: Secondary | ICD-10-CM

## 2024-03-26 DIAGNOSIS — M25552 Pain in left hip: Secondary | ICD-10-CM

## 2024-03-26 DIAGNOSIS — M25362 Other instability, left knee: Secondary | ICD-10-CM

## 2024-03-26 DIAGNOSIS — M25361 Other instability, right knee: Secondary | ICD-10-CM

## 2024-03-26 DIAGNOSIS — Q796 Ehlers-Danlos syndrome, unspecified: Secondary | ICD-10-CM

## 2024-03-26 NOTE — Progress Notes (Signed)
 [facility]    Provider: Nat POUR Tirsa Gail, PA  Date of Exam:  03/26/2024   Patient:  Becky Bryan  DOB:  1996-01-14    AGE:  28 y.o.  MR#:  [id number]     Chief Complaint: Bilateral hip pain, worse on the right.  Bilateral patellar instability    HPI:    History of Present Illness  Becky Bryan is a 28 year old female with Ehlers-Danlos syndrome who presents with joint dislocations and hypermobility.  She has history of Ehlers-Danlos syndrome, recently moved to the area and is looking to establish care locally.    Regarding bilateral hip pain: She has bilateral hip pain for many years.  The right is typically worse than the left, with pain localized globally to the anterior and lateral aspect of the hip.  There has been no recent injury or trauma to either hip.  She has done several sessions of physical therapy, with the most recent about a year and a half ago, which provided some relief.  She is using a cane to help with ambulation.    Regarding bilateral patellar instability, she has been experiencing recurrent patellar instability for many years, with multiple patellar dislocations bilaterally throughout the years.  She has also done some physical therapy for this, though she does still experience patellar subluxation.  She has not had any further imaging or evaluation of her knees.       For other past medical history, social history, family history and past surgical history please see them listed below.    Problem List: Problem List[1]     Past Medical History: Medical History[2]    Social History: Social History[3]    Family History: Family History[4]    Past Surgical History: Past Surgical History[5]    Medications: Current Medications[6]    Allergies: Allergies[7]      ROS:  Constitutional: No fever or chills  Cardiovascular: No chest pain   Respiratory: No shortness of breath   Gastrointestinal: No nausea, vomiting, or diarrhea.  Musculoskeletal: No joint aches, muscle weakness or swelling of  joints/body parts other than that mentioned above/below.  Neurological: No headaches      Physical Exam:   Vitals: Ht 1.549 m (5' 1)   Wt 74.4 kg (164 lb)   LMP 02/24/2024 (Exact Date)   BMI 30.99 kg/m     General: [name] was awake, alert, oriented, easily engaged, displayed logical thinking with clear speech and was neat in appearance. Her general appearance was normal, well-developed and well-nourished.    Gait: The patient demonstrated non antalgic gait with intact coordination and balance.     Head: Normocephalic and atraumatic.   Eyes: Conjunctivae are normal.   Pulmonary/Chest: Effort normal.   Neurological: Pt is alert and oriented to person, place, and time.   Skin: Skin is warm and dry. Pt is not diaphoretic.   Psychiatric: Affect normal.  Extremities:     Right Hip Exam:    Skin intact  Erythema: None  Swelling: None  Effusion: None  Deformities: None  ROM: FF: 120 with some discomfort at the anterior hip ABD: 60 ER: 60  IR: 60  Drehmann's (Passive hip ER when hip flexed): Negative  IP TTP: Negative  Greater Trochanter TTP: Negative  Other TTP: Mild, anterior hip    Log Roll: Negative  Stinchfield (Resisted SLR): Negative  FADIR (impingement Test): Negative  FABER Dru): Negative  EXT/ER Posterior Impingement: Negative  Internal Snapping: Negative  External Snapping: Negative  Ober Test: Negative  Straight Leg Raise: Negative  Crossed Straight Leg Raise: Negative    Strength: WNL    Other areas of Tenderness: None  Sensation Intact to light touch all distributions of leg and foot  DF, PF, EHL motor intact  Foot Perfused with CR <2 sec      Left Hip Exam:    Skin intact  Erythema: None  Swelling: None  Effusion: None  Deformities: None  Leg Length Discrepancy: Negative  ROM: FF: 120  ABD: 60 ER: 60  IR: 60  Drehmann's (Passive hip ER when hip flexed): Negative  IP TTP: None  Greater Trochanter TTP: None  Other TTP: None    Log Roll: Negative  Stinchfield (Resisted SLR): Negative  FADIR  (impingement Test): Negative  FABER Dru): Negative  EXT/ER Posterior Impingement: Negative  Internal Snapping: Negative  External Snapping: Negative  Ober Test: Negative  Straight Leg Raise: Negative  Crossed Straight Leg Raise: Negative    Strength: WNL    Other areas of Tenderness: None  Sensation Intact to light touch all distributions of leg and foot  DF, PF, EHL motor intact  Foot Perfused with CR <2 sec      Right Knee Exam:  Skin intact  Erythema: None present  Swelling: None present  Effusion: None  Deformities: None  ROM: 0-135  Patellofemoral Crepitus: Positive  Patellar glide 3+ quadrants  Patellar Apprehension at 30 deg: Negative  J Sign: Negative  Lachman 1A  Anterior Drawer: WNL  Posterior drawer: WNL  Varus at 0 WNL at 30 WNL  Valgus at 0 WNL at 30 WNL  Medial McMurray's: Negative  Lateral McMurray's: Negative  Medial Joint Line TTP: Negative  Lateral Joint Line TTP: Negative  Strength: WNL    Other areas of Tenderness: None  Sensation Intact to light touch all distributions of leg and foot  DF, PF, EHL motor intact  Foot Perfused with CR <2 sec    Left Knee Exam:  Skin intact  Erythema: None present  Swelling: None present  Effusion: None  Deformities: None  ROM: 0-135  Patellofemoral Crepitus: Positive  Patellar glide 3+ quadrants  Patellar Apprehension at 30 deg: Negative  J Sign: Negative  Lachman 1A  Anterior Drawer: WNL  Posterior drawer: WNL  Varus at 0 WNL at 30 WNL  Valgus at 0 WNL at 30 WNL  Medial McMurray's: Negative  Lateral McMurray's: Negative  Medial Joint Line TTP: Negative  Lateral Joint Line TTP: Negative  Strength: WNL    Other areas of Tenderness: None  Sensation Intact to light touch all distributions of leg and foot  DF, PF, EHL motor intact  Foot Perfused with CR <2 sec      Studies: Weightbearing AP, lateral, PA flexed and sunrise views of the right and left knee were ordered and reviewed on today's visit. The joint space is well preserved in all three compartments. There  are no marginal osteophytes of the joint lines. There are no sclerotic and cystic changes. There are no acute bony abnormalities. The patellae are centralized in femoral sulci.  Significant patella alta bilaterally, equal bilaterally.      Assessments:     Diagnosis ICD-10-CM Associated Order   1. Patellar instability of both knees  M25.361 XR Knee 4+ Views Right    M25.362 MRI knee left without contrast     MRI knee right without contrast     Referral to Physical Therapy - EXTERNAL      2.  Ehlers-Danlos disease  Q79.60 MRI knee left without contrast     MRI knee right without contrast     Referral to Physical Therapy - EXTERNAL      3. Bilateral hip pain  M25.551 XR Hips Bilateral AP Lateral W AP Pelvis    M25.552 Referral to Physical Therapy - EXTERNAL            Plan:   Assessment & Plan  Bilateral knee pain and recurrent patellar instability  Patient has a history of recurrent patellar instability over the years.  She has a history of Ehlers-Danlos syndrome, and has been evaluated for this in the past with positive Beighton's criteria.  We reviewed her x-rays today, as she has significant patella alta bilaterally, likely contributing to her patellar instability in addition to her history of hypermobility.  As she has not had further evaluation of this considering the recurrent instability and chronic bilateral knee pain, would like to evaluate further via MRI of the right and left knee.  She will follow-up with me upon completion of this.  We briefly discussed expectations with this and treatment options, and ultimately recommend restarting physical therapy, as this will likely be part of her plan.  However, we will review her imaging with my supervising physician once MRI has been completed, and we may discuss next steps from there.    Summary of plan:  - Ordered bilateral knee x-rays.  - Will consider MRI of knees if x-rays indicate further investigation is needed.  - Referred to physical therapy for knee  stability and pain management.      Bilateral hip pain, worse on the right  Patient is been experiencing bilateral hip pain for quite some time without any injury or trauma.  Her exam was reassuring today, though she did have pain over the anterior aspect of the hip with full flexion at the right side, though her left hip exam was reassuring.  Ultimately recommend a formal course of physical therapy also for her hips.  We discussed her history of hypermobility and contributing to her hip pain, and would like her to focus on gluteal strengthening, as well as stability exercises.    - Referred to physical therapy for hip stability and pain management.      Ehlers-Danlos syndrome with joint hypermobility  Ehlers-Danlos syndrome with joint hypermobility, primarily affecting hips and knees. Previous physical therapy provided some benefit. Current management includes use of a cane for support. Discussed the importance of long-term physical therapy to maintain stability and manage pain.  - Ordered bilateral knee x-rays.  - Referred to physical therapy for joint stability and pain management.  - Advised follow-up with primary care for cardiology and genetic testing referrals.          The review of the patient's medications does not in any way constitute an endorsement, by this clinician, of their use, dosage, indications, route, efficacy, interactions, or other clinical parameters.     In the beginning of today's appointment, the patient consented to the use of [employer]ictation Software, which was used to generate a portion of this note. Please pardon any potential grammatical errors or typos as aspects of this note may have also been created through speech-to-text software.               [1]   Patient Active Problem List  Diagnosis    Generalized hypermobility of joints    MDD (major depressive disorder), severe (CMS/HCC)    Generalized anxiety  disorder   [2]   Past Medical History:  Diagnosis Date    Anxiety      Depression    [3]   Social History  Tobacco Use    Smoking status: Former     Current packs/day: 0.00     Types: Cigarettes     Quit date: 02/2023     Years since quitting: 1.1     Passive exposure: Never    Smokeless tobacco: Never    Tobacco comments:     Socially when drinking   Vaping Use    Vaping status: Never Used   Substance Use Topics    Alcohol use: Yes     Alcohol/week: 2.0 - 4.0 standard drinks of alcohol     Types: 2 - 4 Standard drinks or equivalent per week     Comment: weekly    Drug use: Yes     Types: Marijuana     Comment: quit June 2025   [4]   Family History  Problem Relation Name Age of Onset    Celiac disease Mother      No known problems Father      Anorexia nervosa Sister      Heart disease Sister          POTs    Diabetes Maternal Grandfather      Stroke Maternal Grandfather      Lung cancer Paternal Grandfather          nicotine use    Leukemia Paternal Aunt      Stomach cancer Paternal Uncle      Breast cancer Other          maternal great aunt   [5]   Past Surgical History:  Procedure Laterality Date    WISDOM TOOTH EXTRACTION Bilateral     5   [6]   Current Outpatient Medications:     lamoTRIgine (LaMICtal) 25 MG tablet, , Disp: , Rfl:   [7]   Allergies  Allergen Reactions    Banana Itching and Swelling

## 2024-05-05 ENCOUNTER — Ambulatory Visit (INDEPENDENT_AMBULATORY_CARE_PROVIDER_SITE_OTHER): Admitting: Family

## 2024-05-05 VITALS — BP 115/84 | HR 88 | Temp 97.9°F | Resp 18 | Ht 61.0 in | Wt 162.0 lb

## 2024-05-05 DIAGNOSIS — R52 Pain, unspecified: Secondary | ICD-10-CM

## 2024-05-05 DIAGNOSIS — J101 Influenza due to other identified influenza virus with other respiratory manifestations: Secondary | ICD-10-CM

## 2024-05-05 DIAGNOSIS — R509 Fever, unspecified: Secondary | ICD-10-CM

## 2024-05-05 DIAGNOSIS — R051 Acute cough: Secondary | ICD-10-CM

## 2024-05-05 DIAGNOSIS — R110 Nausea: Secondary | ICD-10-CM

## 2024-05-05 LAB — ABBOTT BINAX NOW COVID-19 ANTIGEN: BinaxNOW SARS COV2 Antigen POCT: NEGATIVE

## 2024-05-05 LAB — POCT INFLUENZA A/B
POCT Rapid Influenza A AG: POSITIVE — AB
POCT Rapid Influenza B AG: NEGATIVE

## 2024-05-05 LAB — STREP A POCT GOHEALTH: Rapid Strep A Screen POCT: NEGATIVE

## 2024-05-05 MED ORDER — ONDANSETRON 4 MG PO TBDP
4.0000 mg | ORAL_TABLET | Freq: Once | ORAL | Status: AC
  Administered 2024-05-05: 4 mg via ORAL

## 2024-05-05 MED ORDER — ONDANSETRON 4 MG PO TBDP: 4.0000 mg | ORAL_TABLET | Freq: Four times a day (QID) | ORAL | 0 refills | Status: AC | PRN

## 2024-05-05 MED ORDER — BENZONATATE 200 MG PO CAPS: 200.0000 mg | ORAL_CAPSULE | Freq: Three times a day (TID) | ORAL | 0 refills | Status: AC | PRN

## 2024-05-05 MED ORDER — OSELTAMIVIR PHOSPHATE 75 MG PO CAPS: 75.0000 mg | ORAL_CAPSULE | Freq: Two times a day (BID) | ORAL | 0 refills | Status: DC

## 2024-05-05 MED ORDER — OSELTAMIVIR PHOSPHATE 75 MG PO CAPS: 75.0000 mg | ORAL_CAPSULE | Freq: Two times a day (BID) | ORAL | 0 refills | Status: AC

## 2024-05-05 MED ORDER — IPRATROPIUM BROMIDE 0.06 % NA SOLN: 2.0000 | Freq: Four times a day (QID) | NASAL | 0 refills | Status: AC

## 2024-05-05 NOTE — Progress Notes (Signed)
 Patient: Becky Bryan   Date: 05/05/2024   MRN: [id number]           Subjective     Chief Complaint   Patient presents with    Cough     Cough, fever, sneezing, nausea x 2 days           Cough  Associated symptoms include a fever and postnasal drip.     Becky Bryan is a 28 y.o. female  Patient presenting for evaluation of fever, cough, body aches, nausea, irritability. Onset of symptoms was 2 days ago. Patient has associated symptoms of fever, cough, nasal congestion, nausea, body aches. Patient does not have symptoms of shortness of breath, abdominal pain, vomiting, diarrhea, rash. Treatment prior to arrival includes OTC cough medications. Patient has received the flu shot.    Review of Systems   Constitutional:  Positive for fever.   HENT:  Positive for congestion and postnasal drip.    Eyes: Negative.    Respiratory:  Positive for cough.    Cardiovascular: Negative.    Gastrointestinal:  Positive for nausea.   Genitourinary: Negative.    Musculoskeletal: Negative.    Neurological: Negative.          History:  Pertinent Past Medical, Surgical, Family and Social History were reviewed.  Current Medications[1]  Allergies[2]  Medications and Allergies reviewed.         Objective   Vitals:    05/05/24 1329   BP: 115/84   BP Site: Right arm   Patient Position: Sitting   Cuff Size: Medium   Pulse: 88   Resp: 18   Temp: 97.9 F (36.6 C)   TempSrc: Tympanic   SpO2: 99%   Weight: 73.5 kg (162 lb)   Height: 1.549 m (5' 1)     Body mass index is 30.61 kg/m.    Physical Exam  Vitals and nursing note reviewed.   Constitutional:       Appearance: Normal appearance.   HENT:      Head: Normocephalic and atraumatic.      Right Ear: Tympanic membrane and ear canal normal.      Left Ear: Tympanic membrane and ear canal normal.      Nose: Mucosal edema and congestion present.      Mouth/Throat:      Mouth: Mucous membranes are moist.      Pharynx: Postnasal drip present.   Eyes:      Extraocular Movements: Extraocular  movements intact.      Pupils: Pupils are equal, round, and reactive to light.   Cardiovascular:      Rate and Rhythm: Normal rate and regular rhythm.      Pulses: Normal pulses.      Heart sounds: Normal heart sounds.   Pulmonary:      Effort: Pulmonary effort is normal.      Breath sounds: Normal breath sounds.   Abdominal:      General: Abdomen is flat.   Skin:     General: Skin is warm.      Capillary Refill: Capillary refill takes less than 2 seconds.   Neurological:      General: No focal deficit present.      Mental Status: She is alert.              UCC Course   LABS  The following POCT tests were ordered, reviewed and discussed with the patient/family.     Results for orders placed or performed in  visit on 05/05/24 (from the past 24 hours)   POCT Influenza A/B    Collection Time: 05/05/24  1:48 PM   Result Value    POCT QC Pass    POCT Rapid Influenza A AG Positive (A)    POCT Rapid Influenza B AG Negative   Rapid Strep A POCT    Collection Time: 05/05/24  1:48 PM   Result Value    POCT QC Pass    Rapid Strep A Screen POCT Negative   BinaxNow SARS-COV-2 Antigen POCT    Collection Time: 05/05/24  1:49 PM   Result Value    BinaxNOW SARS COV2 Antigen POCT Negative     There were no x-rays reviewed with this patient during the visit.  Current Inpatient Medications with Last Dose Taken[3]       Procedures   Procedures      MDM/Assessment    --See Plan of Care      Differential Diagnosis including but not limited to: Bronchitis, pneumonia, sinusitis, URI, viral syndrome, Pharyngitis, Influenza, COVID-19 and allergic rhinitis    Encounter Diagnoses   Name Primary?    Influenza A Yes    Fever, unspecified     Nausea     Acute cough     Body aches                                                                                                                                                                      Plan  --Follow plan of care as prescribed  --Patient expresses understanding and agrees with  plan  --Patient/caregiver informed to call with any concerns or questions related to Plan of care and Treatment  --Discussed results and diagnosis with patient/family. Reviewed warning signs for worsening condition, as well as, indications for follow-up with primary care physician and return to urgent care clinic. Patient/family expressed understanding of instructions     Influenza infection    Acute respiratory tract infection caused by viral influenza A. Characterized by upper and lower respiratory tract symptoms of rhinorrhea, cough, fever, chills, headache, and muscle aches. Typically presents in winter season. Transmitted through infected respiratory droplets that are aerosolized by coughing, sneezing, or talking. Peaks in late December to March, Antiviral treatment should ideally be given within the first 48 hours of suspected or laboratory-confirmed influenza.     Incubation: 2-3 days (may be as long as 7 days)    Infectivity (Viral load and shedding correlates with symptom severity) Begins 1 day prior to symptom onset (seek treatment in first 48 hours), Declines over 4-5 days. Ceases with fever resolution after 5 days. Symptoms absent after 10 days      Prevention: Flu vaccination    Oseltamivir  (Tamiflu ) / Baloxavir Marboxil (Xofluza) -  Reduces myalgias, fever and flu-like symptoms by 1 day (if started in first 48 hours)   Common side effect: Nausea, Vomiting, Diarrhea     -supportive care to include hydration  - consider dextromethorphan-guaiFENesin 60-1200 MG per 12 hr tablet  twice daily - Adult -- Avoid if you have Cardiac Medical issues   -Consider taking guaiFENesin products only -- safe if you have known cardiac issues  - consider pseudoephedrine (Sudafed) and/or oxymetazoline (Afrin) in the mornings (located behind pharmacy counter) -- Avoid if you have Cardiac Medical issues  - May also consider adding fluticasone (Flonase) up to twice daily   - Consider adding daily loratadine, cetirizine or  fexofenadine (Claritin, Zyrtec, or Allegra)  - consider throat lozenges - consider zinc if available  - Consider Hypertonic saline rinses (NeilMed, Neti Pot) if tolerated, use distilled water  - Alternate ibuprofen (Advil) and acetaminophen  (Tylenol) as needed    Prevention of Viral Infection Supplements (avoid if have known allergy)  --Vitamin C 1000-4000 mg Daily  --Vitamin D 4000-5000iu Daily  --Zinc 50-150mg  Daily  --Garlic 1000 mg twice a day    Follow up with PCP at this time, if signs and symptoms do not improve make sure to follow up sooner; If symptoms get worse make sure to follow up with the ED    This note was generated by the electronic medical records and speech to text software speech recognition and may contain inherent errors or omissions not intended by the user. Grammatical errors, random word insertions, deletions and pronoun errors  are occasional consequences of this technology due to software limitations. Not all errors are caught or corrected. If there are questions or concerns about the content of this note or information contained within the body of this dictation they should be addressed directly with the author for clarification.    Orders Placed This Encounter   Procedures    BinaxNow SARS-COV-2 Antigen POCT    POCT Influenza A/B    Rapid Strep A POCT     Requested Prescriptions     Signed Prescriptions Disp Refills    ondansetron  (ZOFRAN -ODT) 4 MG disintegrating tablet 20 tablet 0     Sig: Dissolve 1 tablet (4 mg) in the mouth every 6 (six) hours as needed for Nausea    ipratropium (ATROVENT ) 0.06 % nasal spray 15 mL 0     Sig: 2 sprays by Nasal route 4 (four) times daily    benzonatate  (TESSALON ) 200 MG capsule 21 capsule 0     Sig: Take 1 capsule (200 mg) by mouth 3 (three) times daily as needed for Cough    oseltamivir  (TAMIFLU ) 75 MG capsule 10 capsule 0     Sig: Take 1 capsule (75 mg) by mouth 2 (two) times daily for 5 days       Discussed results and diagnosis with  patient/family.  Reviewed warning signs for worsening condition, as well as, indications for follow-up with primary care physician and return to urgent care clinic.   Patient/family expressed understanding of instructions.     An After Visit Summary with pertinent information was made available to patient/family via MyChart or in-print.       [1]   Current Outpatient Medications:     lamoTRIgine (LaMICtal) 25 MG tablet, , Disp: , Rfl:     benzonatate  (TESSALON ) 200 MG capsule, Take 1 capsule (200 mg) by mouth 3 (three) times daily as needed for Cough, Disp: 21 capsule, Rfl: 0    ipratropium (ATROVENT )  0.06 % nasal spray, 2 sprays by Nasal route 4 (four) times daily, Disp: 15 mL, Rfl: 0    ondansetron  (ZOFRAN -ODT) 4 MG disintegrating tablet, Dissolve 1 tablet (4 mg) in the mouth every 6 (six) hours as needed for Nausea, Disp: 20 tablet, Rfl: 0    oseltamivir  (TAMIFLU ) 75 MG capsule, Take 1 capsule (75 mg) by mouth 2 (two) times daily for 5 days, Disp: 10 capsule, Rfl: 0  No current facility-administered medications for this visit.  [2]   Allergies  Allergen Reactions    Banana Itching and Swelling   [3]   No current facility-administered medications for this visit.

## 2024-05-05 NOTE — Patient Instructions (Signed)
 Influenza infection    Acute respiratory tract infection caused by viral influenza A. Characterized by upper and lower respiratory tract symptoms of rhinorrhea, cough, fever, chills, headache, and muscle aches. Typically presents in winter season. Transmitted through infected respiratory droplets that are aerosolized by coughing, sneezing, or talking. Peaks in late December to March, Antiviral treatment should ideally be given within the first 48 hours of suspected or laboratory-confirmed influenza.     Incubation: 2-3 days (may be as long as 7 days)    Infectivity (Viral load and shedding correlates with symptom severity) Begins 1 day prior to symptom onset (seek treatment in first 48 hours), Declines over 4-5 days. Ceases with fever resolution after 5 days. Symptoms absent after 10 days      Prevention: Flu vaccination    Oseltamivir  (Tamiflu ) / Baloxavir Marboxil (Xofluza) - Reduces myalgias, fever and flu-like symptoms by 1 day (if started in first 48 hours)   Common side effect: Nausea, Vomiting, Diarrhea     -supportive care to include hydration  - consider dextromethorphan-guaiFENesin 60-1200 MG per 12 hr tablet  twice daily - Adult -- Avoid if you have Cardiac Medical issues   -Consider taking guaiFENesin products only -- safe if you have known cardiac issues  - consider pseudoephedrine (Sudafed) and/or oxymetazoline (Afrin) in the mornings (located behind pharmacy counter) -- Avoid if you have Cardiac Medical issues  - May also consider adding fluticasone (Flonase) up to twice daily   - Consider adding daily loratadine, cetirizine or fexofenadine (Claritin, Zyrtec, or Allegra)  - consider throat lozenges - consider zinc if available  - Consider Hypertonic saline rinses (NeilMed, Neti Pot) if tolerated, use distilled water  - Alternate ibuprofen  (Advil ) and acetaminophen  (Tylenol) as needed    Prevention of Viral Infection Supplements (avoid if have known allergy)  --Vitamin C 1000-4000 mg Daily  --Vitamin  D 4000-5000iu Daily  --Zinc 50-150mg  Daily  --Garlic 1000 mg twice a day
